# Patient Record
Sex: Female | Born: 1965 | Race: White | Hispanic: No | Marital: Married
Health system: Midwestern US, Community
[De-identification: ages and names within clinical notes are randomized; demographics above are authoritative.]

## PROBLEM LIST (undated history)

## (undated) DIAGNOSIS — Z3480 Encounter for supervision of other normal pregnancy, unspecified trimester: Secondary | ICD-10-CM

## (undated) DIAGNOSIS — O9989 Other specified diseases and conditions complicating pregnancy, childbirth and the puerperium: Secondary | ICD-10-CM

## (undated) DIAGNOSIS — O139 Gestational [pregnancy-induced] hypertension without significant proteinuria, unspecified trimester: Secondary | ICD-10-CM

## (undated) DIAGNOSIS — O99810 Abnormal glucose complicating pregnancy: Secondary | ICD-10-CM

## (undated) DIAGNOSIS — O26859 Spotting complicating pregnancy, unspecified trimester: Secondary | ICD-10-CM

## (undated) DIAGNOSIS — F419 Anxiety disorder, unspecified: Secondary | ICD-10-CM

## (undated) DIAGNOSIS — I10 Essential (primary) hypertension: Secondary | ICD-10-CM

## (undated) DIAGNOSIS — R87629 Unspecified abnormal cytological findings in specimens from vagina: Secondary | ICD-10-CM

## (undated) DIAGNOSIS — M1990 Unspecified osteoarthritis, unspecified site: Secondary | ICD-10-CM

## (undated) DIAGNOSIS — F32A Depression, unspecified: Secondary | ICD-10-CM

## (undated) DIAGNOSIS — E785 Hyperlipidemia, unspecified: Secondary | ICD-10-CM

## (undated) HISTORY — PX: WISDOM TOOTH EXTRACTION: SHX21

## (undated) HISTORY — DX: Anxiety disorder, unspecified: F41.9

## (undated) HISTORY — PX: KIDNEY SURGERY: SHX687

## (undated) HISTORY — DX: Essential (primary) hypertension: I10

## (undated) HISTORY — DX: Depression, unspecified: F32.A

## (undated) HISTORY — PX: COLPOSCOPY: SHX161

## (undated) HISTORY — DX: Unspecified abnormal cytological findings in specimens from vagina: R87.629

## (undated) HISTORY — DX: Unspecified osteoarthritis, unspecified site: M19.90

## (undated) HISTORY — DX: Hyperlipidemia, unspecified: E78.5

---

## 2000-11-05 ENCOUNTER — Emergency Department (HOSPITAL_COMMUNITY): Admission: EM | Admit: 2000-11-05 | Discharge: 2000-11-05 | Payer: Self-pay | Admitting: Emergency Medicine

## 2000-11-21 ENCOUNTER — Emergency Department (HOSPITAL_COMMUNITY): Admission: EM | Admit: 2000-11-21 | Discharge: 2000-11-21 | Payer: Self-pay | Admitting: Emergency Medicine

## 2001-03-26 ENCOUNTER — Encounter: Admission: RE | Admit: 2001-03-26 | Discharge: 2001-03-26 | Payer: Self-pay | Admitting: Obstetrics

## 2001-03-26 ENCOUNTER — Other Ambulatory Visit: Admission: RE | Admit: 2001-03-26 | Discharge: 2001-03-26 | Payer: Self-pay | Admitting: Obstetrics

## 2001-10-13 ENCOUNTER — Other Ambulatory Visit: Admission: RE | Admit: 2001-10-13 | Discharge: 2001-10-13 | Payer: Self-pay | Admitting: *Deleted

## 2003-01-14 ENCOUNTER — Encounter: Admission: RE | Admit: 2003-01-14 | Discharge: 2003-01-14 | Payer: Self-pay | Admitting: Internal Medicine

## 2003-08-29 ENCOUNTER — Encounter: Admission: RE | Admit: 2003-08-29 | Discharge: 2003-08-29 | Payer: Self-pay | Admitting: Internal Medicine

## 2003-08-29 ENCOUNTER — Ambulatory Visit (HOSPITAL_COMMUNITY): Admission: RE | Admit: 2003-08-29 | Discharge: 2003-08-29 | Payer: Self-pay | Admitting: Internal Medicine

## 2003-08-29 ENCOUNTER — Encounter: Payer: Self-pay | Admitting: Internal Medicine

## 2004-06-29 ENCOUNTER — Emergency Department (HOSPITAL_COMMUNITY): Admission: EM | Admit: 2004-06-29 | Discharge: 2004-06-29 | Payer: Self-pay | Admitting: Family Medicine

## 2004-07-02 ENCOUNTER — Emergency Department (HOSPITAL_COMMUNITY): Admission: EM | Admit: 2004-07-02 | Discharge: 2004-07-02 | Payer: Self-pay | Admitting: Family Medicine

## 2012-03-12 LAB — BLOOD TYPE, (ABO+RH)
ABO,Rh: O POS
TYPE, ABO & RH, EXTERNAL: O POS

## 2012-03-12 LAB — HIV-1 AB WESTERN BLOT CONFIRM ONLY
HIV, EXTERNAL: NEGATIVE
HIV, External: NEGATIVE

## 2012-03-12 LAB — HEMOGLOBIN: Hgb, External: 10.3

## 2012-03-12 LAB — ANTIBODY SCREEN: Antibody screen, External: NEGATIVE

## 2012-03-12 LAB — RUBELLA AB, IGM

## 2012-03-12 LAB — N GONORRHOEAE, DNA PROBE: Gonorrhea, External: NEGATIVE

## 2012-03-12 LAB — HEP B SURFACE AG: HBsAg, External: NEGATIVE

## 2012-03-12 LAB — CHLAMYDIA DNA PROBE: Chlamydia, External: NEGATIVE

## 2012-03-12 LAB — HM PAP SMEAR: Pap Smear, External: NEGATIVE

## 2012-03-12 LAB — HEMATOCRIT: Hct, External: 32.6

## 2012-03-12 LAB — GLUCOSE, 1 HR: GTT, 1 hr, Glucola, External: 105

## 2012-05-19 ENCOUNTER — Inpatient Hospital Stay
Admit: 2012-05-19 | Discharge: 2012-05-25 | Disposition: A | Discharge status: Home / Self Care | Payer: MEDICAID | Source: Ambulatory Visit | Attending: Obstetrics & Gynecology | Admitting: Obstetrics & Gynecology

## 2012-05-19 DIAGNOSIS — IMO0002 Reserved for concepts with insufficient information to code with codable children: Secondary | ICD-10-CM

## 2012-05-19 LAB — URINALYSIS W/ REFLEX CULTURE
Bilirubin: NEGATIVE
Blood: NEGATIVE
Glucose: NEGATIVE MG/DL
Ketone: NEGATIVE MG/DL
Nitrites: NEGATIVE
Protein: NEGATIVE MG/DL
Specific gravity: 1.016 (ref 1.003–1.030)
Urobilinogen: 0.2 EU/DL (ref 0.2–1.0)
pH (UA): 7 (ref 5.0–8.0)

## 2012-05-19 LAB — URIC ACID: Uric acid: 3.5 MG/DL (ref 2.6–6.0)

## 2012-05-19 LAB — METABOLIC PANEL, COMPREHENSIVE
A-G Ratio: 0.6 — ABNORMAL LOW (ref 1.1–2.2)
ALT (SGPT): 16 U/L (ref 12–78)
AST (SGOT): 16 U/L (ref 15–37)
Albumin: 2.6 g/dL — ABNORMAL LOW (ref 3.5–5.0)
Alk. phosphatase: 101 U/L (ref 50–136)
Anion gap: 13 mmol/L (ref 5–15)
BUN/Creatinine ratio: 16 (ref 12–20)
BUN: 9 MG/DL (ref 6–20)
Bilirubin, total: 0.2 MG/DL (ref 0.2–1.0)
CO2: 24 MMOL/L (ref 21–32)
Calcium: 8.5 MG/DL (ref 8.5–10.1)
Chloride: 101 MMOL/L (ref 97–108)
Creatinine: 0.56 MG/DL (ref 0.45–1.15)
GFR est AA: >60 mL/min/{1.73_m2} (ref >60)
GFR est non-AA: >60 mL/min/{1.73_m2} (ref >60)
Globulin: 4.1 g/dL — ABNORMAL HIGH (ref 2.0–4.0)
Glucose: 88 MG/DL (ref 65–100)
Potassium: 3.7 MMOL/L (ref 3.5–5.1)
Protein, total: 6.7 g/dL (ref 6.4–8.2)
Sodium: 138 MMOL/L (ref 136–145)

## 2012-05-19 LAB — CBC WITH AUTOMATED DIFF
ABS. BASOPHILS: 0 10*3/uL (ref 0.0–0.1)
ABS. EOSINOPHILS: 0.4 10*3/uL (ref 0.0–0.4)
ABS. LYMPHOCYTES: 2.2 10*3/uL (ref 0.8–3.5)
ABS. MONOCYTES: 1 10*3/uL (ref 0.0–1.0)
ABS. NEUTROPHILS: 10.9 10*3/uL — ABNORMAL HIGH (ref 1.8–8.0)
BASOPHILS: 0 % (ref 0–1)
EOSINOPHILS: 3 % (ref 0–7)
HCT: 34.8 % — ABNORMAL LOW (ref 35.0–47.0)
HGB: 11.4 g/dL — ABNORMAL LOW (ref 11.5–16.0)
LYMPHOCYTES: 15 % (ref 12–49)
MCH: 24.2 PG — ABNORMAL LOW (ref 26.0–34.0)
MCHC: 32.8 g/dL (ref 30.0–36.5)
MCV: 73.7 FL — ABNORMAL LOW (ref 80.0–99.0)
MONOCYTES: 7 % (ref 5–13)
NEUTROPHILS: 75 % (ref 32–75)
PLATELET: 182 10*3/uL (ref 150–400)
RBC: 4.72 M/uL (ref 3.80–5.20)
RDW: 22.1 % — ABNORMAL HIGH (ref 11.5–14.5)
WBC: 14.5 10*3/uL — ABNORMAL HIGH (ref 3.6–11.0)

## 2012-05-19 LAB — LD: LD: 167 U/L (ref 81–246)

## 2012-05-19 NOTE — H&P (Signed)
History & Physical    Name: Julie Daniels MRN: [id number]  SSN: [id number]    Date of Birth: [birth year]  Age: 46 y.o.  Sex: female      Subjective:     Reason for Admission:  Pregnancy and hypertension    History of Present Illness: Julie Daniels is a 46 y.o. Caucasian female with an estimated gestational age of [redacted]w[redacted]d with Estimated Date of Delivery: 08/08/12. She was seen in the office today for routine OB visit.  BPs=150/80, 160/82.  She presented late for prenatal care @ 19+wks, BPs have been mildly elevated in office.  BPs at home 120s-130s/60s-70s.  Patient denies abdominal pain  , chest pain, headache , right upper quadrant pain  , shortness of breath, swelling, vaginal bleeding  and vaginal leaking of fluid .    POBHx significant for previous C/S via vertical incision with extension into upper fundus.    Rubella equivocal    DSR=1:18. Declined invasive testing.    Has been followed by MFM with monthly growth scans.    OB History     Grav Para Term Preterm Abortions TAB SAB Ect Mult Living    4 3                Past Medical History   Diagnosis Date   ??? Essential hypertension      Past Surgical History   Procedure Date   ??? Pr cesarean delivery only      1999   ??? Hx other surgical      ectopic pregnancy removal     Social History     Occupational History   ??? Not on file.     Social History Main Topics   ??? Smoking status: Former Smoker     Quit date: 09/16/2003   ??? Smokeless tobacco: Never Used   ??? Alcohol Use: No   ??? Drug Use: No   ??? Sexually Active: No      No family history on file.    Allergies   Allergen Reactions   ??? Tape (Adhesive) Itching     Prior to Admission medications    Medication Sig Start Date End Date Taking? Authorizing Provider   prenatal vit-fe fum-fa-dss (PRENATAL 19) 29-1 mg Tab Take  by mouth.   Yes Historical Provider   ferrous sulfate (IRON) 325 mg (65 mg iron) tablet Take  by mouth Daily (before breakfast).   Yes Historical Provider        Review of Systems:  A comprehensive review of  systems was negative except for that written in the History of Present Illness.     Objective:     Vitals:    Filed Vitals:    05/19/12 1413 05/19/12 1428 05/19/12 1443 05/19/12 1458   BP: 163/73 145/70 145/78 145/77   Pulse: 81 77 77 80   Resp:       Height:       Weight:          No data recorded.    BP  Min: 145/77  Max: 178/84     Physical Exam:  Patient without distress.  Heart: Regular rate and rhythm or S1S2 present  Lung: clear to auscultation throughout lung fields, no wheezes, no rales, no rhonchi and normal respiratory effort  Abdomen: soft, nontender  Fundus: soft and non tender  Lower Extremities:  - Edema No   - No evidence of DVT seen on physical exam.  Negative Homan's sign.  No cords or  calf tenderness.   - Patellar Reflexes: 2+ bilaterally   - Clonus: absent     Membranes:  Intact  Uterine Activity:  None  Fetal Heart Rate:  Reactive  Baseline: 140s-150s per minute       Labs:   Recent Results (from the past 24 hour(s))   CBC WITH AUTOMATED DIFF    Collection Time    05/19/12  1:30 PM       Component Value Range    WBC 14.5 (*) 3.6 - 11.0 K/uL    RBC 4.72  3.80 - 5.20 M/uL    HGB 11.4 (*) 11.5 - 16.0 g/dL    HCT 16.1 (*) 09.6 - 47.0 %    MCV 73.7 (*) 80.0 - 99.0 FL    MCH 24.2 (*) 26.0 - 34.0 PG    MCHC 32.8  30.0 - 36.5 g/dL    RDW 04.5 (*) 40.9 - 14.5 %    PLATELET 182  150 - 400 K/uL    NEUTROPHILS 75  32 - 75 %    LYMPHOCYTES 15  12 - 49 %    MONOCYTES 7  5 - 13 %    EOSINOPHILS 3  0 - 7 %    BASOPHILS 0  0 - 1 %    ABS. NEUTROPHILS 10.9 (*) 1.8 - 8.0 K/UL    ABS. LYMPHOCYTES 2.2  0.8 - 3.5 K/UL    ABS. MONOCYTES 1.0  0.0 - 1.0 K/UL    ABS. EOSINOPHILS 0.4  0.0 - 0.4 K/UL    ABS. BASOPHILS 0.0  0.0 - 0.1 K/UL    DF SMEAR SCANNED      RBC COMMENTS        Value: 2+ ANISOCYTOSIS      1+ MICROCYTOSIS   URIC ACID    Collection Time    05/19/12  1:30 PM       Component Value Range    Uric acid 3.5  2.6 - 6.0 MG/DL   LD    Collection Time    05/19/12  1:30 PM       Component Value Range    LD 167  81 - 246  U/L   METABOLIC PANEL, COMPREHENSIVE    Collection Time    05/19/12  1:30 PM       Component Value Range    Sodium 138  136 - 145 MMOL/L    Potassium 3.7  3.5 - 5.1 MMOL/L    Chloride 101  97 - 108 MMOL/L    CO2 24  21 - 32 MMOL/L    Anion gap 13  5 - 15 mmol/L    Glucose 88  65 - 100 MG/DL    BUN 9  6 - 20 MG/DL    Creatinine 8.11  9.14 - 1.15 MG/DL    BUN/Creatinine ratio 16  12 - 20      GFR est AA >60  >60 ml/min/1.107m2    GFR est non-AA >60  >60 ml/min/1.41m2    Calcium 8.5  8.5 - 10.1 MG/DL    Bilirubin, total 0.2  0.2 - 1.0 MG/DL    ALT 16  12 - 78 U/L    AST 16  15 - 37 U/L    Alk. phosphatase 101  50 - 136 U/L    Protein, total 6.7  6.4 - 8.2 g/dL    Albumin 2.6 (*) 3.5 - 5.0 g/dL    Globulin 4.1 (*) 2.0 - 4.0 g/dL  A-G Ratio 0.6 (*) 1.1 - 2.2     URINALYSIS W/ REFLEX CULTURE    Collection Time    05/19/12  1:30 PM       Component Value Range    Color YELLOW      Appearance CLOUDY      Specific gravity 1.016  1.003 - 1.030      pH 7.0  5.0 - 8.0      Protein NEGATIVE   NEGATIVE MG/DL    Glucose NEGATIVE   NEGATIVE MG/DL    Ketone NEGATIVE   NEGATIVE MG/DL    Bilirubin NEGATIVE   NEGATIVE    Blood NEGATIVE   NEGATIVE    Urobilinogen 0.2  0.2 - 1.0 EU/DL    Nitrites NEGATIVE   NEGATIVE    Leukocyte Esterase SMALL (*) NEGATIVE    WBC 20-50  0 - 4 /HPF    RBC 5-10  0 - 5 /HPF    Epithelial cells 0-5  0 - 5 /LPF    Bacteria 1+ (*) NEGATIVE /HPF    UA:UC IF INDICATED URINE CULTURE ORDERED      Hyaline Cast 0-2  0 - 2       Assessment and Plan:     1.  Hypertension  -labs wnl  -24hr urine in progress  - if total protein 300mg , may consider starting labetalol as she likely has some underlying CHTN.    2.  Previous C/S via vertical incision with extension into upper fundus per operative report.  - not candidate for TOLAC    3.  Rubella equivocal.    - vaccinate postpartum    4.  DSR=1:18  - declined invasive testing    Signed By:  Ileana Ladd, MD     May 19, 2012

## 2012-05-19 NOTE — Progress Notes (Signed)
1300: Patient arrived from Dr. Milinda Hirschfeld office. Blood pressures elevated the patient educated on 24 hour urine collection and states understanding of process. Will continue to monitor.   1500: Dr. Ketter Basil notified of patient blood pressures 145/80 without symptoms at this time. States patient needs to stay here in labor and delivery to complete 24 hour urine sample. Orders for regular diet, 4hour vital signs and nst bid received will continue to monitor.   1720: Dr. Ahrendt Basil rounding on unit, patient blood pressures 180/90's while MD at bedside. Patient blood pressures to be completed more frequently at this time, will continue to monitor.  1920: Bedside and Verbal shift change report given to Dominga Ferry (oncoming nurse) by [place], RN (offgoing nurse).  Report given with SBAR, Kardex, MAR, Accordion and Recent Results.

## 2012-05-19 NOTE — Progress Notes (Signed)
Bedside and Verbal shift change report given to J Theodorous-Smith RN (oncoming nurse) by Lowanda Foster RN (offgoing nurse).  Report given with SBAR, Kardex, Intake/Output and MAR.

## 2012-05-20 LAB — PROTEIN, URINE, 24 HR
Period of collection: 24 hr
Protein,urine 24 hr: 324 mg/24hr — ABNORMAL HIGH (ref <149)
Volume: 1800 mL

## 2012-05-20 NOTE — Progress Notes (Signed)
Antepartum Obstetrics Progress Note    Name: Julie Daniels MRN: [id number]  SSN: [id number]    Date of Birth: 04-17-1966  Age: 46 y.o.  Sex: female      Subjective:      LOS: 1 day    Estimated Date of Delivery: 08/08/12   Gestational Age Today: [redacted]w[redacted]d     Patient admitted for hypertension. States she does have normal fetal movement and does not have headache , right upper quadrant pain  , shortness of breath, swelling, vaginal leaking of fluid  and visual disturbances.    Objective:     Vitals:  Blood pressure 135/60, pulse 80, temperature 98.2 ??F (36.8 ??C), resp. rate 20, height 5\' 6"  (1.676 m), weight 110.678 kg (244 lb).   Temp (24hrs), Avg:98.1 ??F (36.7 ??C), Min:97.9 ??F (36.6 ??C), Max:98.3 ??F (36.8 ??C)    Systolic (24hrs), Avg:158 mmHg, Min:135 mmHg, Max:178 mmHg     Diastolic (24hrs), Avg:76 mmHg, Min:60 mmHg, Max:90 mmHg       Intake and Output:          Physical Exam:  Patient without distress.  Heart: Regular rate and rhythm or S1S2 present  Lung: clear to auscultation throughout lung fields, no wheezes, no rales, no rhonchi and normal respiratory effort  Abdomen: soft, nontender  Fundus: soft and non tender  Lower Extremities:  - Edema No   - No evidence of DVT seen on physical exam.  Negative Homan's sign.  No cords or calf tenderness.   - Patellar Reflexes: 1+ bilaterally   - Clonus: absent       Membranes:  Intact    Uterine Activity:  None    Fetal Heart Rate:  Reassuring, +accels  Baseline: 140s-150s per minute        Labs:   Recent Results (from the past 36 hour(s))   PROTEIN, URINE, 24 HR    Collection Time    05/19/12  1:15 PM       Component Value Range    Period of collection 24      Volume 1800      Protein,urine 24 hr 324 (*) <149 mg/24hr   CBC WITH AUTOMATED DIFF    Collection Time    05/19/12  1:30 PM       Component Value Range    WBC 14.5 (*) 3.6 - 11.0 K/uL    RBC 4.72  3.80 - 5.20 M/uL    HGB 11.4 (*) 11.5 - 16.0 g/dL    HCT 84.1 (*) 32.4 - 47.0 %    MCV 73.7 (*) 80.0 - 99.0 FL    MCH 24.2 (*)  26.0 - 34.0 PG    MCHC 32.8  30.0 - 36.5 g/dL    RDW 40.1 (*) 02.7 - 14.5 %    PLATELET 182  150 - 400 K/uL    NEUTROPHILS 75  32 - 75 %    LYMPHOCYTES 15  12 - 49 %    MONOCYTES 7  5 - 13 %    EOSINOPHILS 3  0 - 7 %    BASOPHILS 0  0 - 1 %    ABS. NEUTROPHILS 10.9 (*) 1.8 - 8.0 K/UL    ABS. LYMPHOCYTES 2.2  0.8 - 3.5 K/UL    ABS. MONOCYTES 1.0  0.0 - 1.0 K/UL    ABS. EOSINOPHILS 0.4  0.0 - 0.4 K/UL    ABS. BASOPHILS 0.0  0.0 - 0.1 K/UL    DF SMEAR SCANNED  RBC COMMENTS        Value: 2+ ANISOCYTOSIS      1+ MICROCYTOSIS   URIC ACID    Collection Time    05/19/12  1:30 PM       Component Value Range    Uric acid 3.5  2.6 - 6.0 MG/DL   LD    Collection Time    05/19/12  1:30 PM       Component Value Range    LD 167  81 - 246 U/L   METABOLIC PANEL, COMPREHENSIVE    Collection Time    05/19/12  1:30 PM       Component Value Range    Sodium 138  136 - 145 MMOL/L    Potassium 3.7  3.5 - 5.1 MMOL/L    Chloride 101  97 - 108 MMOL/L    CO2 24  21 - 32 MMOL/L    Anion gap 13  5 - 15 mmol/L    Glucose 88  65 - 100 MG/DL    BUN 9  6 - 20 MG/DL    Creatinine 0.98  1.19 - 1.15 MG/DL    BUN/Creatinine ratio 16  12 - 20      GFR est AA >60  >60 ml/min/1.56m2    GFR est non-AA >60  >60 ml/min/1.29m2    Calcium 8.5  8.5 - 10.1 MG/DL    Bilirubin, total 0.2  0.2 - 1.0 MG/DL    ALT 16  12 - 78 U/L    AST 16  15 - 37 U/L    Alk. phosphatase 101  50 - 136 U/L    Protein, total 6.7  6.4 - 8.2 g/dL    Albumin 2.6 (*) 3.5 - 5.0 g/dL    Globulin 4.1 (*) 2.0 - 4.0 g/dL    A-G Ratio 0.6 (*) 1.1 - 2.2     URINALYSIS W/ REFLEX CULTURE    Collection Time    05/19/12  1:30 PM       Component Value Range    Color YELLOW      Appearance CLOUDY      Specific gravity 1.016  1.003 - 1.030      pH 7.0  5.0 - 8.0      Protein NEGATIVE   NEGATIVE MG/DL    Glucose NEGATIVE   NEGATIVE MG/DL    Ketone NEGATIVE   NEGATIVE MG/DL    Bilirubin NEGATIVE   NEGATIVE    Blood NEGATIVE   NEGATIVE    Urobilinogen 0.2  0.2 - 1.0 EU/DL    Nitrites NEGATIVE   NEGATIVE     Leukocyte Esterase SMALL (*) NEGATIVE    WBC 20-50  0 - 4 /HPF    RBC 5-10  0 - 5 /HPF    Epithelial cells 0-5  0 - 5 /LPF    Bacteria 1+ (*) NEGATIVE /HPF    UA:UC IF INDICATED URINE CULTURE ORDERED      Hyaline Cast 0-2  0 - 2       Assessment and Plan:      Active Problems:   * No active hospital problems. *      Mild preeclampsia.  24hr urine=324mg , previously 202.  reconsult MFM in AM  BPs still labile, but overall a little improved.   SCDs    Signed By: Ileana Ladd, MD     May 20, 2012

## 2012-05-20 NOTE — Progress Notes (Signed)
Bedside and Verbal shift change report given to [place] RN (Cabin crew) by Arna Snipe RN (offgoing nurse).  Report given with SBAR, Kardex, Intake/Output and MAR.

## 2012-05-20 NOTE — Progress Notes (Addendum)
0715: Report received from Corbin Ade., RN, Will continue to monitor.   0900: Patient blood pressures elevated denies  symptoms will continue to monitor.   1100: Dr. Linam Basil rounding on unit, MD made aware that blood pressures 150's/80s and labile. Will continue to monitor.   1715: 24 hour urine back at this time. Dr Piet Basil notified of result plan to keep patient again tonight for mfm consult in the morning. Parameters to call MD for multiple blood pressures 160/110. Will continue to monitor.   1745: Dr. Whitt Basil on unit, patient made aware plan of care, will continue to monitor.   1955:Bedside and Verbal shift change report given to Vernona Rieger, Charity fundraiser (Cabin crew) by [place], RN (offgoing nurse).  Report given with SBAR, Kardex, MAR and Recent Results.

## 2012-05-21 LAB — CULTURE, URINE
Colonies Counted: <1000
Colony Count: <1000
Culture result:: NO GROWTH
Culture: NO GROWTH

## 2012-05-21 LAB — URINALYSIS W/ REFLEX CULTURE
Bacteria: NEGATIVE /HPF
Bilirubin: NEGATIVE
Glucose: NEGATIVE MG/DL
Leukocyte Esterase: NEGATIVE
Nitrites: NEGATIVE
Protein: NEGATIVE MG/DL
Specific gravity: 1.018 (ref 1.003–1.030)
Urobilinogen: 0.2 EU/DL (ref 0.2–1.0)
pH (UA): 7 (ref 5.0–8.0)

## 2012-05-21 MED FILL — NYSTATIN 100,000 UNIT/G TOPICAL POWDER: 100000 unit/gram | CUTANEOUS | Qty: 15

## 2012-05-21 NOTE — Progress Notes (Signed)
Sat with pt and held EFM under pannus.  FHR 160, fetal movement audible, accels to 175-180 with FM.

## 2012-05-21 NOTE — Consults (Signed)
Indication: Advanced Maternal Age 46, Polyhydramnios  657.03, Obesity 649.13/278.00.   Maternal disease: Hypertension.   ____________________________________________________________________________  History: Age: 46 years. Maternal age at [facility]: 46 years. Gravida: 4 Para: 3.   Current Pregnancy: Pre- pregnancy data: Weight 226 lbs. Height 5 ft 6 ins. BMI 36.5.  Obstetric History: Mode of last delivery: Vaginal Delivery x 2, then Cesarean section x 1.  ____________________________________________________________________________  Dating:  LMP: 12/10/11 EDC: 09/15/12 GA by LMP: [redacted]w[redacted]d  Earlier Assessment on: 03/12/12 EDC: 08/08/12 GA by earlier assessment: [redacted]w[redacted]d  Best Overall Assessment: 05/21/12 EDC: 08/08/12 Assessed GA: [redacted]w[redacted]d  The Best Overall Assessment is based on an earlier assessment on 03/12/12.  ____________________________________________________________________________  Growth Scan:  Singleton gestation.   Fetal heart activity: present. Fetal heart rate: 152 bpm.   Fetal presentation: vertex.   Cord: 3 vessels.   Placenta: anterior.     Summary of Ultrasound Findings:  Transabdominal US. U/S machine: GE Voluson E8 Expert. U/S view: limited by adiposity.     ____________________________________________________________________________  Fetal Wellbeing Assessment:  Amniotic fluid: polyhydramnios. AFI: 30.3 cm. MVP: 8.0 cm. Q1: 7.4 cm. Q2: 7.8 cm. Q3: 7.1 cm. Q4: 8.0 cm.   Biophysical Profile: Fetal body movements: normal (2), Fetal tone: normal (2), Fetal breathing movements: abnormal (0), Amniotic fluid volume: normal (2), Non-stress test: normal (2). Score 8 / 10.     ____________________________________________________________________________  Doppler:  Fetal Doppler:  Umbilical Artery: PS 48.1 cm/s    ED 20.19 cm/s    S/D ratio 2.34     RI 0.57     PI 0.79     TAMX 34.96 cm/s     ____________________________________________________________________________  Report Summary:  Impression: Biophysical profile  and umbilical artery Doppler studies are performed to evaluate fetal well-being. Exam is limited by maternal habitus (BMI 37).    The patient is 46 years old (AMA); her quad screen was abnormal, with a 1 in 18 risk of Down syndrome. She declined further testing. There are two prior term vaginal deliveries over 20 years ago. Her last pregnancy in 1999 was twins with a co-twin demise (diagnosed at 46 weeks). She went into labor and delivered her stillborn at 46 weeks, followed by a Cesarean section for the viable twin (was in [place]). She does not have a known history of hypertension, but her last encounter with a health care professional was 6 years ago. Blood pressures during the pregnancy are in the 140-150/70-80 range. She had an ultrasound two days ago in our office; her EFW was at the 59th percentile with polyhydramnios present (AFI 30.1 cm). By report her 50 gram screen was normal.     The patient was admitted on 05/19/12 due to concerns about preeclampsia. Blood pressures in the hospital are in the 135-170/60-90 range. A 24 hour urine detected 324 mg of protein. PIH labs are normal, and she denies any PIH symptoms.     Today fetal assessment is reassuring. Her polyhydramnios remains present, with an AFI of 30.3 cm. See BPP score and umbilical artery Doppler report above.    I am suspicious the patient does have chronic hypertension, possibly with superimposed mild preeclampsia. It is reasonable to start a dose of Labetalol, observe for 24-48 hours, then discharge her home. I would not continually increase her dose of antihypertensive medication, as this may mask worsening preeclampsia. I would get a 3 hour GTT while she is here (possible explanation of her poly) as long as she is at least 48 hours out from antenatal  corticosteroids (if they were given). The prior plan was to recheck amniotic fluid in a few weeks- this remains appropriate. I would start weekly antenatal testing at 32-34 weeks. All questions and  concerns were addressed.     Recommendations: Keep previously scheduled appointment on 06/16/12.

## 2012-05-21 NOTE — Progress Notes (Signed)
Antepartum Obstetrics Progress Note    Name: Julie Daniels MRN: [id number]  SSN: [id number]    Date of Birth: [birth year]  Age: 46 y.o.  Sex: female      Subjective:      LOS: 2 days    Estimated Date of Delivery: 08/08/12   Gestational Age Today: [redacted]w[redacted]d     Patient admitted for preeclampsia. States she does have normal fetal movement and some back pain and does not have abdominal pain  , headache , right upper quadrant pain  , shortness of breath, swelling, vaginal bleeding , vaginal leaking of fluid  and visual disturbances.    Objective:     Vitals:  Blood pressure 160/80, pulse 82, temperature 98 ??F (36.7 ??C), resp. rate 18, height 5\' 6"  (1.676 m), weight 110.678 kg (244 lb).   Temp (24hrs), Avg:98 ??F (36.7 ??C), Min:98 ??F (36.7 ??C), Max:98 ??F (36.7 ??C)    Systolic (24hrs), Avg:160 mmHg, Min:160 mmHg, Max:160 mmHg     Diastolic (24hrs), Avg:80 mmHg, Min:80 mmHg, Max:80 mmHg       Intake and Output:          Physical Exam:  Patient without distress.  Heart: Regular rate and rhythm or S1S2 present  Lung: clear to auscultation throughout lung fields, no wheezes, no rales, no rhonchi and normal respiratory effort  Abdomen: soft, nontender  Fundus: soft and non tender  Lower Extremities:  - Edema No   - No evidence of DVT seen on physical exam.  Negative Homan's sign.  No cords or calf tenderness.   - Patellar Reflexes: 1+ bilaterally   - Clonus: absent       Membranes:  Intact    Uterine Activity:  None    Fetal Heart Rate:  Baseline: 150s per minute, tracing this AM fragmented, will repeat this evening       Labs: No results found for this or any previous visit (from the past 36 hour(s)).    Assessment and Plan:         HTN.  Likely underlying CHTN with probable superimposed mild preeclampsia.  Seen by MFM today.  Recommend starting labetalol.  May be candidate for outpt management if good response.  Also recommend 3hr GTT for polyhydramnios.  -labetalol 100mg  BID  - NPO after MN, will try to do 3hr GTT tomorrow     Signed By: Ileana Ladd, MD     May 21, 2012

## 2012-05-21 NOTE — Progress Notes (Signed)
05/21/2012 02:34P CM met with pt to discuss her disposition.Pt has two adult children in [place].She has a 46 year old @ home.Her husband does not drive but is having a friend drive him where he needs to go.In discussing the seriousness of maintaining bedrest @ home if prescribed by her physician,pt states she will be able to maintain bedrest.She states she can direct her thirteen y.o to do chores for her from her bed.She states her husband will cook and help her also.Pt has a home blood pressure wrist monitor and will record and report high blood pressures to her physician..Pt is to determine systolic and diastolic parameters with her physician prior to discharge.Pt may buy urine strips @ the [employer] to check for protein in her urine if her physician feels this is necessary.Signs and symptoms to report to her physician reviewed with ZO:[id number],BJYNWGN in her urine,sudden weight gain,N/V,abdominal pain,headaches,visual changes,hyperreflexia.each back done with pt and pt was able to repeat back most symptoms.Pt informed that nursing will review these on her discharge also.Thank you.Marliss Czar

## 2012-05-22 LAB — GLUCOSE, GESTATIONAL, 3 HR TOLERANCE
GLUCOSE, 1 HR: 170 MG/DL (ref 65–180)
GLUCOSE, 2 HR: 156 MG/DL — ABNORMAL HIGH (ref 65–155)
GLUCOSE, 3 HR: 144 MG/DL — ABNORMAL HIGH (ref 65–140)
Glucose fasting, gestational: 94 MG/DL (ref 65–95)

## 2012-05-22 LAB — GLUCOSE, POC: Glucose (POC): 201 mg/dL — ABNORMAL HIGH (ref 65–105)

## 2012-05-22 MED ADMIN — labetalol (NORMODYNE) tablet 100 mg: ORAL | @ 16:00:00 | NDC 00172436460

## 2012-05-22 MED ADMIN — nystatin (MYCOSTATIN) 100,000 unit/gram powder: TOPICAL | @ 10:00:00 | NDC 68308015215

## 2012-05-22 MED ADMIN — labetalol (NORMODYNE) tablet 100 mg: ORAL | @ 01:00:00 | NDC 00172436460

## 2012-05-22 MED FILL — LABETALOL 100 MG TAB: 100 mg | ORAL | Qty: 1

## 2012-05-22 NOTE — Progress Notes (Signed)
Antepartum Obstetrics Progress Note    Name: Julie Daniels MRN: [id number]  SSN: [id number]    Date of Birth: January 07, 1966  Age: 46 y.o.  Sex: female      Subjective:      LOS: 3 days    Estimated Date of Delivery: 08/08/12   Gestational Age Today: [redacted]w[redacted]d     Patient admitted for hypertension. States she does have normal fetal movement and does not have contractions, headache , nausea and vomiting, right upper quadrant pain  , shortness of breath, swelling, vaginal bleeding  and vaginal leaking of fluid .    Objective:     Vitals:  Blood pressure 158/76, pulse 92, temperature 98.5 ??F (36.9 ??C), resp. rate 24, height 5\' 6"  (1.676 m), weight 110.678 kg (244 lb).   Temp (24hrs), Avg:98.1 ??F (36.7 ??C), Min:97.8 ??F (36.6 ??C), Max:98.5 ??F (36.9 ??C)    Systolic (24hrs), Avg:156 mmHg, Min:138 mmHg, Max:175 mmHg     Diastolic (24hrs), Avg:80 mmHg, Min:76 mmHg, Max:82 mmHg       Intake and Output:          Physical Exam:  Patient without distress.  Heart: Regular rate and rhythm or S1S2 present  Lung: clear to auscultation throughout lung fields, no wheezes, no rales, no rhonchi and normal respiratory effort  Abdomen: soft, nontender  Fundus: soft and non tender  Lower Extremities:  - Edema No   - No evidence of DVT seen on physical exam.  No cords or calf tenderness.   - Patellar Reflexes: 1+ bilaterally   - Clonus: absent       Membranes:  Intact    Uterine Activity:  None    Fetal Heart Rate:  Fragmented tracing this AM, reactive last PM        Labs:   Recent Results (from the past 36 hour(s))   URINALYSIS W/ REFLEX CULTURE    Collection Time    05/21/12  2:45 PM       Component Value Range    Color YELLOW      Appearance CLEAR      Specific gravity 1.018  1.003 - 1.030      pH 7.0  5.0 - 8.0      Protein NEGATIVE   NEGATIVE MG/DL    Glucose NEGATIVE   NEGATIVE MG/DL    Ketone TRACE (*) NEGATIVE MG/DL    Bilirubin NEGATIVE   NEGATIVE    Blood SMALL (*) NEGATIVE    Urobilinogen 0.2  0.2 - 1.0 EU/DL    Nitrites NEGATIVE    NEGATIVE    Leukocyte Esterase NEGATIVE   NEGATIVE    WBC 5-10  0 - 4 /HPF    RBC 10-20  0 - 5 /HPF    Epithelial cells 0-5  0 - 5 /LPF    Bacteria NEGATIVE   NEGATIVE /HPF    UA:UC IF INDICATED URINE CULTURE ORDERED      Hyaline Cast 0-2  0 - 2   GLUCOSE, GESTATIONAL, 3 HR TOLERANCE    Collection Time    05/22/12  6:00 AM       Component Value Range    GESTATIONAL 3HR GTT          GLUCOSE, FASTING 94  65 - 95 MG/DL    GLUCOSE, 1 HR 782  65 - 180 MG/DL    GLUCOSE, 2 HR 956 (*) 65 - 155 MG/DL    GLUCOSE, 3 HR 213 (*) 65 - 140 MG/DL  GLUCOSE, POC    Collection Time    05/22/12  9:02 AM       Component Value Range    POC GLUCOSE 201 (*) 65 - 105 mg/dL    Performed by Roddie Mc         Assessment and Plan:      Active Problems:   * No active hospital problems. *      1.  Hypertension.  Likely chronic hypertension.  Mild proteinuria (324mg  on 24hr urine).  Started on labetalol as per MFM recommendations.  Has had 2 doses.  Some improvement in BPs.  Will continue.  If BPs tomorrow 130s-140s/80s, then should be OK for outpt management.  Rec she continue BR at home.  If d/c'd home, then will need jug to collect 24 hr urine and turn in to office on Tuesday.    2.  GDM.  3hr GTT done today for polyhydramnios.  Results c/w GDM.  Will place DM consult.        Signed By: Ileana Ladd, MD     May 22, 2012

## 2012-05-22 NOTE — Progress Notes (Addendum)
0800:  Mrs. Metsker given oral glucose 3 hr gtt liquid, orange 100gram.    Prenatal vitamin, zantac 150mg  tablet and iron pill 325mg  tablet taken now.

## 2012-05-23 LAB — CULTURE, URINE
Colonies Counted: <1000
Colony Count: <1000
Culture result:: NO GROWTH
Culture: NO GROWTH

## 2012-05-23 LAB — GLUCOSE, POC
Glucose (POC): 142 mg/dL — ABNORMAL HIGH (ref 65–105)
Glucose (POC): 99 mg/dL (ref 65–105)
Glucose (POC): 102 mg/dL (ref 65–105)
Glucose (POC): 164 mg/dL — ABNORMAL HIGH (ref 65–105)
Glucose (POC): 104 mg/dL (ref 65–105)

## 2012-05-23 MED ADMIN — nystatin (MYCOSTATIN) 100,000 unit/gram powder: TOPICAL | @ 13:00:00 | NDC 68308015215

## 2012-05-23 MED ADMIN — labetalol (NORMODYNE) tablet 100 mg: ORAL | @ 14:00:00 | NDC 00172436460

## 2012-05-23 MED ADMIN — labetalol (NORMODYNE) tablet 100 mg: ORAL | @ 01:00:00 | NDC 00172436460

## 2012-05-23 MED FILL — LABETALOL 100 MG TAB: 100 mg | ORAL | Qty: 1

## 2012-05-23 NOTE — Progress Notes (Signed)
High Risk Obstetrics Progress Note    Name: Julie Daniels MRN: [id number]  SSN: [id number]    Date of Birth: 1966/02/06  Age: 46 y.o.  Sex: female      Subjective:      LOS: 4 days    Estimated Date of Delivery: 08/08/12   Gestational Age Today: [redacted]w[redacted]d     Patient admitted for chronic hypertension. States she does not have abdominal pain  , chest pain, contractions, fever, headache  and nausea and vomiting.    Objective:     Vitals:  Blood pressure 144/81, pulse 78, temperature 98 ??F (36.7 ??C), resp. rate 18, height 5\' 6"  (1.676 m), weight 110.678 kg (244 lb).   Temp (24hrs), Avg:98.4 ??F (36.9 ??C), Min:98 ??F (36.7 ??C), Max:98.6 ??F (37 ??C)    Systolic (24hrs), Avg:148 mmHg, Min:117 mmHg, Max:175 mmHg     Diastolic (24hrs), Avg:75 mmHg, Min:60 mmHg, Max:89 mmHg       Intake and Output:          Physical Exam:  Patient without distress.  Heart: Regular rate and rhythm  Lung: clear to auscultation throughout lung fields and normal respiratory effort  Perineum: blood absent, amniotic fluid absent       Membranes:  Intact    Uterine Activity:  None    Fetal Heart Rate:  Reactive        Labs:   Recent Results (from the past 36 hour(s))   GLUCOSE, GESTATIONAL, 3 HR TOLERANCE    Collection Time    05/22/12  6:00 AM       Component Value Range    GESTATIONAL 3HR GTT          GLUCOSE, FASTING 94  65 - 95 MG/DL    GLUCOSE, 1 HR 782  65 - 180 MG/DL    GLUCOSE, 2 HR 956 (*) 65 - 155 MG/DL    GLUCOSE, 3 HR 213 (*) 65 - 140 MG/DL   GLUCOSE, POC    Collection Time    05/22/12  9:02 AM       Component Value Range    POC GLUCOSE 201 (*) 65 - 105 mg/dL    Performed by Roddie Mc     GLUCOSE, POC    Collection Time    05/22/12  9:17 PM       Component Value Range    POC GLUCOSE 142 (*) 65 - 105 mg/dL    Performed by Verna Czech     GLUCOSE, POC    Collection Time    05/23/12  8:49 AM       Component Value Range    POC GLUCOSE 99  65 - 105 mg/dL    Performed by Melford Aase         Assessment and Plan:      Active Problems:   * No  active hospital problems. *      Diabetes-Gestational:  Blood sugar monitoring at fasting and 2 hours after each meal Chronic HTN   Polyhydramnios NST q shift  Continue POC  Patient seen and evaluated by Ailene Ards MD    Signed By: Lynford Citizen, MD     May 23, 2012

## 2012-05-23 NOTE — Progress Notes (Addendum)
Verbal shift change report given to Serita Kyle, RN (oncoming nurse) by Neena Rhymes, RN (offgoing nurse).  Report given with SBAR, MAR and Recent Results'.       2038- Accucheck right middle finger, 103    2130- Discussed possible plans of care for pt. Pt expresses concern over not being able to be discharged tomorrow. Discussed plan of care and pt given opportunity to ask questions.    24- Verbal shift change report given to Melford Aase, RN (oncoming nurse) by Serita Kyle, rn (offgoing nurse).  Report given with SBAR, MAR and Recent Results.

## 2012-05-24 LAB — GLUCOSE, POC
Glucose (POC): 103 mg/dL (ref 65–105)
Glucose (POC): 103 mg/dL (ref 65–105)
Glucose (POC): 105 mg/dL (ref 65–105)
Glucose (POC): 99 mg/dL (ref 65–105)

## 2012-05-24 MED ADMIN — labetalol (NORMODYNE) tablet 100 mg: ORAL | @ 01:00:00 | NDC 00172436460

## 2012-05-24 MED ADMIN — labetalol (NORMODYNE) tablet 100 mg: ORAL | @ 13:00:00 | NDC 00172436460

## 2012-05-24 MED FILL — LABETALOL 100 MG TAB: 100 mg | ORAL | Qty: 1

## 2012-05-24 NOTE — Progress Notes (Signed)
High Risk Obstetrics Progress Note    Name: Julie Daniels MRN: [id number]  SSN: [id number]    Date of Birth: 05-03-1966  Age: 46 y.o.  Sex: female      Subjective:      LOS: 5 days    Estimated Date of Delivery: 08/08/12   Gestational Age Today: [redacted]w[redacted]d     Patient admitted for chronic hypertension. States she does not have abdominal pain  , contractions, nausea and vomiting, right upper quadrant pain   and shortness of breath. Pt reporting small amount of spotting notice while wiping.    Objective:     Vitals:  Blood pressure 134/66, pulse 80, temperature 98 ??F (36.7 ??C), resp. rate 18, height 5\' 6"  (1.676 m), weight 110.678 kg (244 lb).   Temp (24hrs), Avg:77.9 ??F (25.5 ??C), Min:37.4 ??F (3 ??C), Max:98.2 ??F (36.8 ??C)    Systolic (24hrs), Avg:143 mmHg, Min:133 mmHg, Max:159 mmHg     Diastolic (24hrs), Avg:71 mmHg, Min:60 mmHg, Max:82 mmHg       Intake and Output:          Physical Exam:  Patient without distress.       Membranes:  Intact    Uterine Activity:  None    Fetal Heart Rate:  Reactive        Labs:   Recent Results (from the past 36 hour(s))   GLUCOSE, POC    Collection Time    05/23/12  8:49 AM       Component Value Range    POC GLUCOSE 99  65 - 105 mg/dL    Performed by Melford Aase     GLUCOSE, POC    Collection Time    05/23/12 12:17 PM       Component Value Range    POC GLUCOSE 102  65 - 105 mg/dL    Performed by Neena Rhymes     GLUCOSE, POC    Collection Time    05/23/12  3:38 PM       Component Value Range    POC GLUCOSE 164 (*) 65 - 105 mg/dL    Performed by Neena Rhymes     GLUCOSE, POC    Collection Time    05/23/12  4:44 PM       Component Value Range    POC GLUCOSE 104  65 - 105 mg/dL    Performed by Neena Rhymes     GLUCOSE, POC    Collection Time    05/23/12  8:51 PM       Component Value Range    POC GLUCOSE 103  65 - 105 mg/dL    Performed by Serita Kyle     GLUCOSE, POC    Collection Time    05/24/12  8:06 AM       Component Value Range    POC GLUCOSE 103  65 - 105 mg/dL    Performed by Melford Aase         Assessment and Plan:      Active Problems:   * No active hospital problems. *    Chronic HTN, gestational diabetic  Continue monitoring      Signed By: Theora Master, MD     May 24, 2012

## 2012-05-24 NOTE — Progress Notes (Addendum)
Verbal shift change report given to Serita Kyle, RN (oncoming nurse) by Melford Aase, RN (offgoing nurse).  Report given with SBAR, MAR and Recent Results.     2300- Iced added to 24 hour urine.

## 2012-05-24 NOTE — Progress Notes (Addendum)
1025 Dr Ailene Ards updated on pts bp's and vaginal spottingx1  from last night. Orders received for 24hr urine to be started.   1400 24 hr urine start at 1145.

## 2012-05-25 LAB — PROTEIN, URINE, 24 HR
Period of collection: 24 hr
Protein,urine 24 hr: 352 mg/24hr — ABNORMAL HIGH (ref <149)
Volume: 1850 mL

## 2012-05-25 LAB — GLUCOSE, POC
Glucose (POC): 85 mg/dL (ref 65–105)
Glucose (POC): 96 mg/dL (ref 65–105)
Glucose (POC): 99 mg/dL (ref 65–105)
Glucose (POC): 96 mg/dL (ref 65–105)

## 2012-05-25 LAB — CREATININE, UR, 24 HR
Creatinine, urine 24 hr: 1766 MG/24HR (ref 600–2500)
Period of collection: 24 hr
Total volume, urine: 1850 mL

## 2012-05-25 MED ORDER — LABETALOL 100 MG TAB
100 mg | ORAL_TABLET | Freq: Two times a day (BID) | ORAL | Status: DC
Start: 2012-05-25 — End: 2012-07-19

## 2012-05-25 MED ADMIN — diphenhydrAMINE (BENADRYL) 25 mg capsule: @ 03:00:00 | NDC 00904530660

## 2012-05-25 MED ADMIN — nystatin (MYCOSTATIN) 100,000 unit/gram powder: TOPICAL | NDC 68308015215

## 2012-05-25 MED ADMIN — labetalol (NORMODYNE) tablet 100 mg: ORAL | @ 13:00:00 | NDC 00172436460

## 2012-05-25 MED ADMIN — labetalol (NORMODYNE) tablet 100 mg: ORAL | @ 02:00:00 | NDC 00172436460

## 2012-05-25 MED ADMIN — nystatin (MYCOSTATIN) 100,000 unit/gram powder: TOPICAL | @ 14:00:00 | NDC 68308015215

## 2012-05-25 MED FILL — LABETALOL 100 MG TAB: 100 mg | ORAL | Qty: 1

## 2012-05-25 MED FILL — DIPHENHYDRAMINE 25 MG CAP: 25 mg | ORAL | Qty: 1

## 2012-05-25 NOTE — Progress Notes (Signed)
Verbal shift change report given to Susan Beardsley, RN (oncoming nurse) by Nichole Britt, RN (offgoing nurse).  Report given with SBAR, MAR and Recent Results.

## 2012-05-25 NOTE — Progress Notes (Signed)
S - seen by diabetic teaching.  No HA, visual changes, CP, SOB, abd pain, swelling.  Good FM  O - BPs mainly 120s/70s, occ SBP in 140s    24hr urine=352mg  total protein    A&P - Will discharge home on bedrest.          - Labetalol BID          - RTO 1 wk, turn in 24hr urine          -Pre-e prec

## 2012-05-25 NOTE — Other (Signed)
DTC Consult Note    Recommendations/ Comments: Pt has been instructed on BG meter and GDM meal plan.  I gave her a mail order kit for her meter- they will contact MD for Rx for testing supplies.  She is scheduled to see me OP on 06/01/12 and will likely need an insurance authorization for OP diabetes education, which MD office will need to get.     Consult received for:  []              Assessment of home management     [x]              Meter/monitoring     []              Insulin instruction     [x]              New GDM diagnosis     [x]              Outpatient education     []              Insulin pump patient     []              Insulin infusion     []              DKA/HHS    Chart reviewed and initial evaluation complete on [employer].    Patient is a 46 y.o. female with newly diagnosed GDM. She states no family history of T2DM or GDM in her previous 4 pregnancies.    Pt will start BG monitoring at home 4 times per day- fasting and 2 hours pp per MD request.  I taught her a One Touch Ultra 2 meter.  She was able to perform on test on herself and her BG 1.5 hr after lunch today was 126 mg/dl.    Assessed and instructed patient on the following: .     ??  interpretation of lab results and dx of GDM  ??  blood sugar goals (<95 Fasting; <120 2 hr pp)  ??  exercise  ?? SMBG skills- pt was able to demonstrate proper use of meter  ??  nutrition- pt will have to eliminate juice and cereal for breakfast and not eat fruit before noon  ??  referred to Diabetes Educator    Encouraged the following:   ?? increased exercise- if no restrictions by MD  ??  dietary modifications: reviewed GDM meal plan, pt was able to grasp concepts.  Encouraged her to add a bedtime snack  ??  regular blood sugar monitoring: 4 times daily- fasting and 2 hr pp    Provided patient with the following: [x]              GDM education materials               []              Insulin education materials               []              CHO counting education materials                [x]              Outpatient DTC contact number               [x]              Glucometer               []   Insulin start kit- vial/syringe               []              Insulin start kit- pen    Patient was able to give return demonstration of [x]        glucometer []        saline injection with []       no/ [x]        minimal assistance needed.  []        Nurse to have patient self inject prior to discharge.    A1c:   No results found for this basename: HBA1C, HGBE8       POC Glucose last 24hrs:   Lab Results   Component Value Date/Time    POC GLUCOSE 99 05/25/2012 12:03 PM    POC GLUCOSE 96 05/25/2012  8:22 AM    POC GLUCOSE 85 05/24/2012  9:32 PM    POC GLUCOSE 99 05/24/2012  3:54 PM    POC GLUCOSE 105 05/24/2012 11:40 AM    POC GLUCOSE 103 05/24/2012  8:06 AM    POC GLUCOSE 103 05/23/2012  8:51 PM    POC GLUCOSE 104 05/23/2012  4:44 PM       Lab Results   Component Value Date/Time    Creatinine 0.56 05/19/2012  1:30 PM       Current hospital DM medication: none     Will continue to follow as needed.    Thank you.  Marciel Offenberger K. Fulton Mole, RD, CDE

## 2012-05-25 NOTE — Progress Notes (Signed)
Antepartum Obstetrics Progress Note    Name: Julie Daniels MRN: [id number]  SSN: [id number]    Date of Birth: 1966/10/08  Age: 46 y.o.  Sex: female      Subjective:      LOS: 6 days    Estimated Date of Delivery: 08/08/12   Gestational Age Today: [redacted]w[redacted]d     Patient admitted for hypertension. States she does have normal fetal movement and does not have abdominal pain  , chest pain, contractions, headache , nausea and vomiting, pelvic pressure, right upper quadrant pain  , shortness of breath, swelling, vaginal leaking of fluid  and visual disturbances.    Objective:     Vitals:  Blood pressure 127/68, pulse 85, temperature 98.3 ??F (36.8 ??C), resp. rate 24, height 5\' 6"  (1.676 m), weight 110.678 kg (244 lb).   Temp (24hrs), Avg:98.2 ??F (36.8 ??C), Min:98.1 ??F (36.7 ??C), Max:98.3 ??F (36.8 ??C)    Systolic (24hrs), Avg:137 mmHg, Min:118 mmHg, Max:163 mmHg     Diastolic (24hrs), Avg:68 mmHg, Min:54 mmHg, Max:81 mmHg       Intake and Output:          Physical Exam:  Patient without distress.  Heart: Regular rate and rhythm or S1S2 present  Lung: clear to auscultation throughout lung fields, no wheezes, no rales, no rhonchi and normal respiratory effort  Abdomen: soft, nontender  Fundus: soft and non tender  Lower Extremities:  - Edema No   - No evidence of DVT seen on physical exam.  Negative Homan's sign.  No cords or calf tenderness.   - Patellar Reflexes: 1+ bilaterally   - Clonus: absent       Membranes:  Intact    Uterine Activity:  None    Fetal Heart Rate:  Baseline: 150s per minute        Labs:   Recent Results (from the past 36 hour(s))   GLUCOSE, POC    Collection Time    05/24/12  8:06 AM       Component Value Range    POC GLUCOSE 103  65 - 105 mg/dL    Performed by Melford Aase     GLUCOSE, POC    Collection Time    05/24/12 11:40 AM       Component Value Range    POC GLUCOSE 105  65 - 105 mg/dL    Performed by Melford Aase     PROTEIN, URINE, 24 HR    Collection Time    05/24/12 11:45 AM       Component Value  Range    Period of collection 24      Volume 1850      Protein,urine 24 hr 352 (*) <149 mg/24hr   GLUCOSE, POC    Collection Time    05/24/12  3:54 PM       Component Value Range    POC GLUCOSE 99  65 - 105 mg/dL    Performed by Kirstie Peri     GLUCOSE, POC    Collection Time    05/24/12  9:32 PM       Component Value Range    POC GLUCOSE 85  65 - 105 mg/dL    Performed by Serita Kyle     GLUCOSE, POC    Collection Time    05/25/12  8:22 AM       Component Value Range    POC GLUCOSE 96  65 - 105 mg/dL    Performed by  Phoebe Sharps     CREATININE, UR, 24 HR    Collection Time    05/25/12 11:45 AM       Component Value Range    Period of collection 24      Volume 1850      Creatinine,urine 24 hr 1766  600 - 2500 MG/24HR   GLUCOSE, POC    Collection Time    05/25/12 12:03 PM       Component Value Range    POC GLUCOSE 99  65 - 105 mg/dL    Performed by Phoebe Sharps     GLUCOSE, POC    Collection Time    05/25/12  3:02 PM       Component Value Range    POC GLUCOSE 96  65 - 105 mg/dL    Performed by Phoebe Sharps         Assessment and Plan:      Active Problems:   * No active hospital problems. *      Hypertension - CHTN, mild preeclampsia  - BPs much improved on labetalol 100mg  BID  - 24hr urine shows stable proteinuria=324mg   - OK to d/c home as per MFM.  Bedrest.  Continue labetalol 100mg  BID.  Can monitor BPs at home.  Will need to return to hospital if SBPs >/= 160, DBP >/= 100  -preeclampsia precautions reviewed    A1DM, newly diagnosed.  Seen by diabetic educator earlier today  - monitor glucose per protocol  - bring readings to appt  - [facility]    Signed By: Ileana Ladd, MD     May 25, 2012

## 2012-05-28 ENCOUNTER — Inpatient Hospital Stay: Payer: MEDICAID

## 2012-05-28 NOTE — Progress Notes (Signed)
0100- no blood visualized on peri pad after 3 hours, no contractions noted on fetal monitor.  71- Spoke with Dr. Zettie Cooley on phone regarding patient status, BPs 119/63 last documented, no vaginal bleeding, orders to discharge pt home.  6045- Discharge instructions given to pt. Patient verbalizes understanding. No other concerns raised by patient.

## 2012-05-28 NOTE — Progress Notes (Signed)
0133- pt ambulated off unit with significant other.

## 2012-05-28 NOTE — H&P (Signed)
Pt is a 46 y.o.female.[id number] para2 The patient presents with complaint of vaginal spotting with wiping.  The patient denies LOF, contracitons, N/V/F/C.  Pt reports good fetal movement.  Pregnancy uncomplicated.    Past Medical History   Diagnosis Date   ??? Essential hypertension        Visit Vitals   Item Reading   ??? BP 119/63   ??? Pulse 67   ??? Temp 98.4 ??F (36.9 ??C)   ??? Resp 20   ??? Ht 5\' 6"  (1.676 m)   ??? Wt 110.678 kg (244 lb)   ??? BMI 39.38 kg/m2   ??? SpO2 99%       No data found.           EXAM:  Cervical Exam:  Unchanged from previous exam  Membranes:  Intact  Uterine Activity: None  Fetal Heart Rate: Baseline: 135 per minute          ASSESSMENT:    IUP @ 29 weeks  No evidence of vaginal bleeding        PLAN:   Discharge home  Preterm labor precautions reviewed

## 2012-06-02 ENCOUNTER — Encounter

## 2012-06-24 NOTE — Discharge Summary (Signed)
Name:       Julie Daniels, Julie Daniels                 Admitted:    05/25/2012                                               Discharged:  05/25/2012  Account #:  [id number]                     DOB:         09-Apr-1966  Consultant: Ileana Ladd, MD                Age          46                                 DISCHARGE SUMMARY      ADMISSION DIAGNOSES:  1. Intrauterine pregnancy at 46 weeks 3 days' gestation.  2. Hypertension, chronic versus preeclampsia.    DISCHARGE DIAGNOSES:  1. Intrauterine pregnancy at 74 weeks' gestation.  2. Chronic hypertension.  3. Mild preeclampsia.  4. Gestational diabetes.  5. Polyhydramnios.    HISTORY OF PRESENT ILLNESS: This is a 46 year old gravida 4, para 2-1-0-3  at 46 weeks 3 days' gestation. She was seen in the office for a routine OB  visit and was noted to have elevated blood pressures, the first 150/80, a  repeat 160/82. She was directed to Labor and Delivery for further  evaluation. Initial blood work was normal and she was admitted for  collection of a 24-hour urine and serial blood pressures.    HOSPITAL COURSE: The patient was admitted and placed on bedrest. A 24-hour  urine was initiated and maternal-fetal medicine consult placed. Her blood  pressures initially improved somewhat; however, they did continue to be  somewhat labile. Her 24-hour urine returned with 324 mg of protein  (baseline 24-hour urine collected early in the pregnancy had returned with  202 mg total protein. She was seen by Maternal-Fetal Medicine, who felt  that her blood pressures were elevated at least in part to chronic  hypertension. Recommendation was to start labetalol 100 mg b.i.d. and  continue to monitor blood pressures. However, they recommended not  increasing her medication if this was ineffective. Ultrasound showed  polyhydramnios with an AFI of 30.3. They also recommended that a 3 hour GTT  be performed.    The labetalol was initiated, and a 3 hour GTT performed. These results did  return  consistent with gestational diabetes with a fasting of 94, 1 hour of  170, a 2 hour of 156, and a 3 hour of 144.    A diabetes teaching consult was placed. She was started on a diabetic diet.  Her blood pressures did improve on labetalol and drop down to the 120s over  70s. As per MFM recommendations, she was discharged home on bedrest.    DISCHARGE MEDICATIONS: Labetalol 100 mg p.o. b.i.d.    DIET: Diabetic diet.    ACTIVITY: Bedrest.    FOLLOWUP: In the office in 1 week with a 24 hour urine collection.    CONDITION: Good.    DISPOSITION: Home.        Reviewed on 06/24/2012 12:32 PM  Ileana Ladd, MD    cc:    Ileana Ladd, MD      DMM/wmx; D: 06/23/2012 04:32 P; T: 06/24/2012 11:35 A; DOC# [id number]; Job#  [id number]

## 2012-06-30 ENCOUNTER — Inpatient Hospital Stay: Payer: MEDICAID

## 2012-06-30 LAB — CBC WITH AUTOMATED DIFF
ABS. BASOPHILS: 0 10*3/uL (ref 0.0–0.1)
ABS. EOSINOPHILS: 0.6 10*3/uL — ABNORMAL HIGH (ref 0.0–0.4)
ABS. LYMPHOCYTES: 1.9 10*3/uL (ref 0.8–3.5)
ABS. MONOCYTES: 0.8 10*3/uL (ref 0.0–1.0)
ABS. NEUTROPHILS: 10.6 10*3/uL — ABNORMAL HIGH (ref 1.8–8.0)
BASOPHILS: 0 % (ref 0–1)
EOSINOPHILS: 4 % (ref 0–7)
HCT: 38.8 % (ref 35.0–47.0)
HGB: 13.2 g/dL (ref 11.5–16.0)
LYMPHOCYTES: 14 % (ref 12–49)
MCH: 26.7 PG (ref 26.0–34.0)
MCHC: 34 g/dL (ref 30.0–36.5)
MCV: 78.5 FL — ABNORMAL LOW (ref 80.0–99.0)
MONOCYTES: 6 % (ref 5–13)
NEUTROPHILS: 76 % — ABNORMAL HIGH (ref 32–75)
PLATELET: 184 10*3/uL (ref 150–400)
RBC: 4.94 M/uL (ref 3.80–5.20)
RDW: 18.4 % — ABNORMAL HIGH (ref 11.5–14.5)
WBC: 13.9 10*3/uL — ABNORMAL HIGH (ref 3.6–11.0)

## 2012-06-30 LAB — METABOLIC PANEL, COMPREHENSIVE
A-G Ratio: 0.6 — ABNORMAL LOW (ref 1.1–2.2)
ALT (SGPT): 19 U/L (ref 12–78)
AST (SGOT): 16 U/L (ref 15–37)
Albumin: 2.7 g/dL — ABNORMAL LOW (ref 3.5–5.0)
Alk. phosphatase: 102 U/L (ref 50–136)
Anion gap: 11 mmol/L (ref 5–15)
BUN/Creatinine ratio: 23 — ABNORMAL HIGH (ref 12–20)
BUN: 15 MG/DL (ref 6–20)
Bilirubin, total: 0.3 MG/DL (ref 0.2–1.0)
CO2: 24 MMOL/L (ref 21–32)
Calcium: 8.9 MG/DL (ref 8.5–10.1)
Chloride: 103 MMOL/L (ref 97–108)
Creatinine: 0.65 MG/DL (ref 0.45–1.15)
GFR est AA: >60 mL/min/{1.73_m2} (ref >60)
GFR est non-AA: >60 mL/min/{1.73_m2} (ref >60)
Globulin: 4.2 g/dL — ABNORMAL HIGH (ref 2.0–4.0)
Glucose: 94 MG/DL (ref 65–100)
Potassium: 3.9 MMOL/L (ref 3.5–5.1)
Protein, total: 6.9 g/dL (ref 6.4–8.2)
Sodium: 138 MMOL/L (ref 136–145)

## 2012-06-30 LAB — LD: LD: 168 U/L (ref 81–246)

## 2012-06-30 LAB — PROTEIN, URINE, 24 HR
Period of collection: 24 hr
Protein,urine 24 hr: 510 mg/24hr — ABNORMAL HIGH (ref <149)
Volume: 2550 mL

## 2012-06-30 LAB — URIC ACID: Uric acid: 3.7 MG/DL (ref 2.6–6.0)

## 2012-06-30 NOTE — Progress Notes (Addendum)
1345 Spoke to Dr Chopra Basil, labs reviewed, NST reactive, BP's reviewed. LD and uric acid not resulted yet. Orders to discharge to home at this time with pre eclampsia precautions and to keep follow up appt for next week in the office. Also to bring 24 hour urine to next office visit. Read back.

## 2012-07-07 ENCOUNTER — Inpatient Hospital Stay: Payer: MEDICAID

## 2012-07-07 LAB — METABOLIC PANEL, COMPREHENSIVE
A-G Ratio: 0.7 — ABNORMAL LOW (ref 1.1–2.2)
ALT (SGPT): 18 U/L (ref 12–78)
AST (SGOT): 21 U/L (ref 15–37)
Albumin: 2.6 g/dL — ABNORMAL LOW (ref 3.5–5.0)
Alk. phosphatase: 105 U/L (ref 50–136)
Anion gap: 11 mmol/L (ref 5–15)
BUN/Creatinine ratio: 28 — ABNORMAL HIGH (ref 12–20)
BUN: 12 MG/DL (ref 6–20)
Bilirubin, total: 0.3 MG/DL (ref 0.2–1.0)
CO2: 22 MMOL/L (ref 21–32)
Calcium: 8.7 MG/DL (ref 8.5–10.1)
Chloride: 104 MMOL/L (ref 97–108)
Creatinine: 0.43 MG/DL — ABNORMAL LOW (ref 0.45–1.15)
GFR est AA: >60 mL/min/{1.73_m2} (ref >60)
GFR est non-AA: >60 mL/min/{1.73_m2} (ref >60)
Globulin: 3.8 g/dL (ref 2.0–4.0)
Glucose: 91 MG/DL (ref 65–100)
Potassium: 3.9 MMOL/L (ref 3.5–5.1)
Protein, total: 6.4 g/dL (ref 6.4–8.2)
Sodium: 137 MMOL/L (ref 136–145)

## 2012-07-07 LAB — CREATININE, UR, 24 HR
Creatinine, urine 24 hr: 1520 MG/24HR (ref 600–2500)
Period of collection: 24 hr
Total volume, urine: 2200 mL

## 2012-07-07 LAB — CBC WITH AUTOMATED DIFF
ABS. BASOPHILS: 0 10*3/uL (ref 0.0–0.1)
ABS. EOSINOPHILS: 0.5 10*3/uL — ABNORMAL HIGH (ref 0.0–0.4)
ABS. LYMPHOCYTES: 1.9 10*3/uL (ref 0.8–3.5)
ABS. MONOCYTES: 0.6 10*3/uL (ref 0.0–1.0)
ABS. NEUTROPHILS: 8.9 10*3/uL — ABNORMAL HIGH (ref 1.8–8.0)
BASOPHILS: 0 % (ref 0–1)
EOSINOPHILS: 4 % (ref 0–7)
HCT: 37.9 % (ref 35.0–47.0)
HGB: 13.1 g/dL (ref 11.5–16.0)
LYMPHOCYTES: 16 % (ref 12–49)
MCH: 27.1 PG (ref 26.0–34.0)
MCHC: 34.6 g/dL (ref 30.0–36.5)
MCV: 78.5 FL — ABNORMAL LOW (ref 80.0–99.0)
MONOCYTES: 5 % (ref 5–13)
NEUTROPHILS: 75 % (ref 32–75)
PLATELET: 176 10*3/uL (ref 150–400)
RBC: 4.83 M/uL (ref 3.80–5.20)
RDW: 17.4 % — ABNORMAL HIGH (ref 11.5–14.5)
WBC: 12 10*3/uL — ABNORMAL HIGH (ref 3.6–11.0)

## 2012-07-07 LAB — PROTEIN, URINE, 24 HR
Period of collection: 24 hr
Protein,urine 24 hr: 638 mg/24hr — ABNORMAL HIGH (ref <149)
Volume: 2200 mL

## 2012-07-07 LAB — LD: LD: 178 U/L (ref 81–246)

## 2012-07-07 LAB — PTT: aPTT: 30.6 s — ABNORMAL HIGH (ref 23.0–30.0)

## 2012-07-07 LAB — URIC ACID: Uric acid: 3.6 MG/DL (ref 2.6–6.0)

## 2012-07-07 LAB — PROTHROMBIN TIME + INR
INR: 1 (ref 0.9–1.1)
Prothrombin time: 10.5 s (ref 9.4–11.7)

## 2012-07-07 NOTE — Progress Notes (Addendum)
1145 Dr Banales Basil at nurses station reviewed strip requested some more monitoring at this time  1338 spoke with Dr Porter Basil regarding pt status, labs reviewed and FHR status given, verbal order to discharge home received at this time  1350 discharge instructions reviewed with pt and copy given to her

## 2012-07-07 NOTE — Progress Notes (Signed)
1250: Pt called out for help, assisted pt to the restroom at this time.

## 2012-07-14 ENCOUNTER — Inpatient Hospital Stay: Payer: MEDICAID

## 2012-07-14 LAB — METABOLIC PANEL, COMPREHENSIVE
A-G Ratio: 0.7 — ABNORMAL LOW (ref 1.1–2.2)
ALT (SGPT): 21 U/L (ref 12–78)
AST (SGOT): 30 U/L (ref 15–37)
Albumin: 2.8 g/dL — ABNORMAL LOW (ref 3.5–5.0)
Alk. phosphatase: 114 U/L (ref 50–136)
Anion gap: 16 mmol/L — ABNORMAL HIGH (ref 5–15)
BUN/Creatinine ratio: 29 — ABNORMAL HIGH (ref 12–20)
BUN: 15 MG/DL (ref 6–20)
Bilirubin, total: 0.3 MG/DL (ref 0.2–1.0)
CO2: 20 MMOL/L — ABNORMAL LOW (ref 21–32)
Calcium: 9 MG/DL (ref 8.5–10.1)
Chloride: 102 MMOL/L (ref 97–108)
Creatinine: 0.51 MG/DL (ref 0.45–1.15)
GFR est AA: >60 mL/min/{1.73_m2} (ref >60)
GFR est non-AA: >60 mL/min/{1.73_m2} (ref >60)
Globulin: 4 g/dL (ref 2.0–4.0)
Glucose: 94 MG/DL (ref 65–100)
Potassium: 3.9 MMOL/L (ref 3.5–5.1)
Protein, total: 6.8 g/dL (ref 6.4–8.2)
Sodium: 138 MMOL/L (ref 136–145)

## 2012-07-14 LAB — PROTEIN, URINE, 24 HR
Period of collection: 24 hr
Protein,urine 24 hr: 638 mg/24hr — ABNORMAL HIGH (ref <149)
Volume: 2200 mL

## 2012-07-14 LAB — CBC WITH AUTOMATED DIFF
ABS. BASOPHILS: 0 10*3/uL (ref 0.0–0.1)
ABS. EOSINOPHILS: 0.5 10*3/uL — ABNORMAL HIGH (ref 0.0–0.4)
ABS. LYMPHOCYTES: 2.1 10*3/uL (ref 0.8–3.5)
ABS. MONOCYTES: 0.8 10*3/uL (ref 0.0–1.0)
ABS. NEUTROPHILS: 9.8 10*3/uL — ABNORMAL HIGH (ref 1.8–8.0)
BASOPHILS: 0 % (ref 0–1)
EOSINOPHILS: 4 % (ref 0–7)
HCT: 39.6 % (ref 35.0–47.0)
HGB: 13.6 g/dL (ref 11.5–16.0)
LYMPHOCYTES: 16 % (ref 12–49)
MCH: 27.1 PG (ref 26.0–34.0)
MCHC: 34.3 g/dL (ref 30.0–36.5)
MCV: 78.9 FL — ABNORMAL LOW (ref 80.0–99.0)
MONOCYTES: 6 % (ref 5–13)
NEUTROPHILS: 74 % (ref 32–75)
PLATELET: 161 10*3/uL (ref 150–400)
RBC: 5.02 M/uL (ref 3.80–5.20)
RDW: 17.1 % — ABNORMAL HIGH (ref 11.5–14.5)
WBC: 13.2 10*3/uL — ABNORMAL HIGH (ref 3.6–11.0)

## 2012-07-14 LAB — CREATININE, UR, 24 HR
Creatinine, urine 24 hr: 1529 MG/24HR (ref 600–2500)
Period of collection: 24 hr
Total volume, urine: 2200 mL

## 2012-07-14 LAB — URIC ACID: Uric acid: 4.1 MG/DL (ref 2.6–6.0)

## 2012-07-14 LAB — LD: LD: 156 U/L (ref 81–246)

## 2012-07-14 NOTE — Progress Notes (Signed)
1424 Spoke to Dr Blakeman Basil, labs reviewed, orders to discharge to home at this time, and continue plan for c section Friday. Read back.

## 2012-07-14 NOTE — Progress Notes (Signed)
Provided patient with discharge instructions. Confirmed pt understanding of her induction scheduled on Friday.  Pt verbalized understanding of all education.

## 2012-07-17 ENCOUNTER — Inpatient Hospital Stay
Admit: 2012-07-17 | Discharge: 2012-07-20 | Disposition: A | Discharge status: Home / Self Care | Payer: MEDICAID | Attending: Obstetrics & Gynecology | Admitting: Obstetrics & Gynecology

## 2012-07-17 DIAGNOSIS — O34219 Maternal care for unspecified type scar from previous cesarean delivery: Secondary | ICD-10-CM

## 2012-07-17 LAB — METABOLIC PANEL, COMPREHENSIVE
A-G Ratio: 0.7 — ABNORMAL LOW (ref 1.1–2.2)
ALT (SGPT): 21 U/L (ref 12–78)
AST (SGOT): 21 U/L (ref 15–37)
Albumin: 2.7 g/dL — ABNORMAL LOW (ref 3.5–5.0)
Alk. phosphatase: 115 U/L (ref 50–136)
Anion gap: 12 mmol/L (ref 5–15)
BUN/Creatinine ratio: 27 — ABNORMAL HIGH (ref 12–20)
BUN: 15 MG/DL (ref 6–20)
Bilirubin, total: 0.3 MG/DL (ref 0.2–1.0)
CO2: 22 MMOL/L (ref 21–32)
Calcium: 9 MG/DL (ref 8.5–10.1)
Chloride: 103 MMOL/L (ref 97–108)
Creatinine: 0.56 MG/DL (ref 0.45–1.15)
GFR est AA: >60 mL/min/{1.73_m2} (ref >60)
GFR est non-AA: >60 mL/min/{1.73_m2} (ref >60)
Globulin: 4 g/dL (ref 2.0–4.0)
Glucose: 96 MG/DL (ref 65–100)
Potassium: 3.8 MMOL/L (ref 3.5–5.1)
Protein, total: 6.7 g/dL (ref 6.4–8.2)
Sodium: 137 MMOL/L (ref 136–145)

## 2012-07-17 LAB — CBC WITH AUTOMATED DIFF
ABS. BASOPHILS: 0 10*3/uL (ref 0.0–0.1)
ABS. EOSINOPHILS: 0.7 10*3/uL — ABNORMAL HIGH (ref 0.0–0.4)
ABS. LYMPHOCYTES: 2.7 10*3/uL (ref 0.8–3.5)
ABS. MONOCYTES: 0.8 10*3/uL (ref 0.0–1.0)
ABS. NEUTROPHILS: 10.2 10*3/uL — ABNORMAL HIGH (ref 1.8–8.0)
BASOPHILS: 0 % (ref 0–1)
EOSINOPHILS: 5 % (ref 0–7)
HCT: 39.3 % (ref 35.0–47.0)
HGB: 13.5 g/dL (ref 11.5–16.0)
LYMPHOCYTES: 19 % (ref 12–49)
MCH: 27.3 PG (ref 26.0–34.0)
MCHC: 34.4 g/dL (ref 30.0–36.5)
MCV: 79.4 FL — ABNORMAL LOW (ref 80.0–99.0)
MONOCYTES: 6 % (ref 5–13)
NEUTROPHILS: 70 % (ref 32–75)
PLATELET: 180 10*3/uL (ref 150–400)
RBC: 4.95 M/uL (ref 3.80–5.20)
RDW: 16.9 % — ABNORMAL HIGH (ref 11.5–14.5)
WBC: 14.4 10*3/uL — ABNORMAL HIGH (ref 3.6–11.0)

## 2012-07-17 LAB — GYN RAPID GP B STREP

## 2012-07-17 MED ADMIN — ceFAZolin in 0.9% NS (ANCEF) IVPB Soln 2 g: INTRAVENOUS | @ 12:00:00 | NDC 09999966850

## 2012-07-17 MED ADMIN — lactated ringers infusion: INTRAVENOUS | @ 14:00:00 | NDC 00409795309

## 2012-07-17 MED ADMIN — diphenhydrAMINE (BENADRYL) injection 12.5 mg: INTRAVENOUS | @ 14:00:00 | NDC 63323066401

## 2012-07-17 MED ADMIN — oxyCODONE-acetaminophen (PERCOCET) 5-325 mg per tablet 1 Tab: ORAL | @ 23:00:00 | NDC 68084035511

## 2012-07-17 MED ADMIN — lactated ringers infusion 1,000 mL: INTRAVENOUS | @ 10:00:00 | NDC 00338011704

## 2012-07-17 MED ADMIN — measles, mumps & rubella Vacc (PF) (M-M-R II) injection 0.5 mL: SUBCUTANEOUS | @ 18:00:00 | NDC 00006468100

## 2012-07-17 MED ADMIN — morphine (PF) PCA 150 mg/30 ml: INTRAVENOUS | @ 15:00:00 | NDC 00409602804

## 2012-07-17 MED ADMIN — lactated ringers infusion 1,000 mL: INTRAVENOUS | @ 11:00:00 | NDC 00338011704

## 2012-07-17 MED ADMIN — labetalol (NORMODYNE) tablet 100 mg: ORAL | @ 23:00:00 | NDC 00172436460

## 2012-07-17 MED ADMIN — ketorolac (TORADOL) injection 30 mg: INTRAVENOUS | @ 21:00:00 | NDC 64679075801

## 2012-07-17 MED FILL — BOOSTRIX TDAP 2.5 LF UNIT-8 MCG-5 LF/0.5 ML INTRAMUSCULAR SYRINGE: INTRAMUSCULAR | Qty: 0.5

## 2012-07-17 MED FILL — MORPHINE (PF) 150 MG/30 ML CONCENTRATED INFUSION: 150 mg/30 mL | INTRAVENOUS | Qty: 30

## 2012-07-17 MED FILL — KETOROLAC TROMETHAMINE 30 MG/ML INJECTION: 30 mg/mL (1 mL) | INTRAMUSCULAR | Qty: 1

## 2012-07-17 MED FILL — LACTATED RINGERS IV: INTRAVENOUS | Qty: 1000

## 2012-07-17 MED FILL — LABETALOL 100 MG TAB: 100 mg | ORAL | Qty: 1

## 2012-07-17 MED FILL — EPHEDRINE SULFATE 50 MG/ML IJ SOLN: 50 mg/mL | INTRAMUSCULAR | Qty: 1

## 2012-07-17 MED FILL — FENTANYL CITRATE (PF) 50 MCG/ML IJ SOLN: 50 mcg/mL | INTRAMUSCULAR | Qty: 2

## 2012-07-17 MED FILL — CEFAZOLIN 2 GRAM/50 ML NS IVPB: INTRAVENOUS | Qty: 50

## 2012-07-17 MED FILL — KETAMINE 10 MG/ML IJ SOLN: 10 mg/mL | INTRAMUSCULAR | Qty: 20

## 2012-07-17 MED FILL — OXYCODONE-ACETAMINOPHEN 5 MG-325 MG TAB: 5-325 mg | ORAL | Qty: 1

## 2012-07-17 MED FILL — MIDAZOLAM 1 MG/ML IJ SOLN: 1 mg/mL | INTRAMUSCULAR | Qty: 2

## 2012-07-17 MED FILL — M-M-R II (PF) 1,000-12,500 TCID50/0.5 ML SUBCUTANEOUS SOLUTION: 1000-12500 TCID50/0.5 mL | SUBCUTANEOUS | Qty: 1

## 2012-07-17 MED FILL — OXYTOCIN 10 UNIT/ML INJECTION: 10 unit/mL | INTRAMUSCULAR | Qty: 4

## 2012-07-17 MED FILL — DIPHENHYDRAMINE HCL 50 MG/ML IJ SOLN: 50 mg/mL | INTRAMUSCULAR | Qty: 1

## 2012-07-17 MED FILL — MORPHINE (PF) 0.5 MG/ML IJ SOLN: 0.5 mg/mL | INTRAMUSCULAR | Qty: 10

## 2012-07-17 MED FILL — ONDANSETRON (PF) 4 MG/2 ML INJECTION: 4 mg/2 mL | INTRAMUSCULAR | Qty: 2

## 2012-07-17 MED FILL — NALOXONE 0.4 MG/ML INJECTION: 0.4 mg/mL | INTRAMUSCULAR | Qty: 1

## 2012-07-17 NOTE — Op Note (Signed)
Cesarean Section Delivery Procedure Note         Name: Julie Daniels      Medical Record Number: [id number]      Date of Birth: 18-Mar-1966     Today's Date: July 17, 2012      Preoperative Diagnosis:  Previous C/S with vertical extension into upper fundus, not OK for TOLAC                                              A1DM                                              CHTN                                              Mild preeclampsia, superimposed     Postoperative Diagnosis: same    Procedure: Low Cervical Transverse Procedure(s):  CESAREAN SECTION    Surgeon(s):  Dondre Catalfamo Estrellita Ludwig, MD    Anesthesia: Spinal    EBL:  500    Prophylactic Antibiotics: Ancef         Fetal Description: singleton female    Birth Information:   Information for the patient's newborn:  Stroh, Female [[id number]]          Umbilical Cord: 3 vessels present    Placenta:  manual removal    Specimens: * No specimens in log *            Complications:  none    Procedure Details:    The patient was taken to the operating room, where spinal anesthesia was administered and found to be adequate. The patient was placed in a left-tilt position, the pannus taped,  and prepped and draped in the normal sterile fashion, including placement of a foley catheter.      A knife was used to incise the skin in a Pfannenstiel fashion.    Bovie cautery was used to carry the incision through the subcutaneous tissue and for point hemostasis.  The fascia was knicked in the midline with the cautery.  The fascial incision was extended bilaterally with mayo scissors.  The fascial edges were grasped with Kocher clamps, and blunt, sharp, and cautery dissection used to dissect the fascia from the underlying muscle bellies.  The muscle bellies were bluntly divided and pushed aside.   The peritoneum was entered bluntly and the peritoneal incision stretched.  Cautery was used to further divide the muscles in the midline in a cephalad and caudal direction with care to avoid injury  to the bladder and underlying structures.  Some adhesions were noted between the right upper anterior uterine wall and the anterior abdominal wall.  Because of this, an Alexis self-retaining retractor was not used.  A bladder flap was created with blunt and sharp dissection with Metzenbaum scissors and a bladder blade placed to retract the bladder.     A low transverse uterine incision was made with the scalpel and developed by applying traction in a cephalad and caudal direction.   Amniotomy was performed and the fluid was copius amount clear.  The  head was  elevated to the level of the incision and delivered without difficulty. The nose and mouth were bulb suctioned. The body was delivered, the cord was clamped and cut and the baby was handed off to the waiting neonatal care unit staff.  Attempt was made to deliver the placenta using gentle traction and fundal massage, however the cord avulsed when the placenta was only partially delivered.  The main portion of the placenta was grasped and delivered without difficulty.     Because of the adhesions, the uterus was not exteriorized.  The uterine cavity was cleared of all clots and debris. The uterine incision was closed with number 1 Monocryl  in two layers, the first a running locking fashion, the second an imbricating layer.  Some persistent oozing was noted inferior to the incision in the midline.  0-chromic was used to place figure-of-8 stitches x2.  Good hemostasis was confirmed.  The abdomen and pelvis were irrigated with warm normal saline. The uterine incision was again inspected and good hemostasis was again reassured.     The muscle bellies were inspected and good hemostasis confirmed The fascia was closed with 0 PDS in a running fashion.  The deep subcutaneous tissue was reapproximated with 3.0 plain gut in a running fashion.  The skin was closed with Insorb skin staples.  Steristrips were applied.  The patient tolerated the procedure well. Sponge, lap,  and needle counts were correct times three and the patient and baby were taken to recovery/postpartum room in stable condition.    Signed: Ileana Ladd, MD      July 17, 2012

## 2012-07-17 NOTE — Progress Notes (Signed)
Problem: Patient Education: Go to Patient Education Activity  Goal: Patient/Family Education  Outcome: Progressing Towards Goal    Reviewed effects/risks of late preterm birth on initiation of breastfeeding including infant's sleepiness, ineffective or missed breastfeedings, infant's decreased stamina to sustain prolonged latch and effective breastfeeding, decreased energy reserves related to low birth wt and inability to stimulate milk supply.   Recommended interventions include skin to skin prior to breastfeed, complement/supplement feeding and pumping  Per instructions.     Comments:   Pt will successfully establish breastfeeding by feeding in response to early feeding cues   or wake every 3h, will obtain deep latch, and will keep log of feedings/output.    Breast Assessment  Left Breast: Large  Left Nipple: Everted;Intact;Short  (Shield applied due to shallow latching)  Right Breast: Large  Right Nipple: Everted;Intact;Short   Breast- Feeding Assessment  Attends Breast-Feeding Classes: No (Experienced BF mom)  Breast-Feeding Experience: Yes  Breast Trauma/Surgery: No  Type/Quality: Good (Biological Nurturing tips/techniques shared.)  Lactation Consultant Visits  Breast-Feedings: Good   Mother/Infant Observation  Mother Observation: Alignment;Nipple round on release;Lets baby end feeding;Recognizes feeding cues;Sleepy after feeding  Infant Observation: Feeding cues;Lips flanged, lower;Rhythmic suck;Lips flanged, upper;Opens mouth;Relaxed after feeding  LATCH Documentation  Latch: Grasps breast, tongue down, lips flanged, rhythmic sucking  Audible Swallowing: A few with stimulation  Type of Nipple: Everted (after stimulation) (Rationale for use of shield in late preterm infant discussed)  Comfort (Breast/Nipple): Soft/non-tender  Hold (Positioning): Full assist, teach one side, mother does other, staff holds  [facility] Score: 8    Biological Nurturing breastfeeding principles taught.  How Biological Nurturing (BN)   promotes optimal breastfeeding (BF) sessions discussed.  Mother encouraged to seek comfortable semi-reclining breastfeeding positions.  Infant placed frontally along maternal contour.  Primitive innate feeding reflexes/behaviors of the newborn discussed. BN tips and techniques shared; assisted with comfortable breastfeeding positioning.

## 2012-07-17 NOTE — Op Note (Signed)
Cesarean Section Delivery Procedure Note         Name: Julie Daniels      Medical Record Number: [id number]      Date of Birth: 09/21/1966     Today's Date: July 17, 2012      Preoperative Diagnosis:  Previous C/S with vertical extension into upper fundus, not OK for TOLAC                                              A1DM                                              CHTN                                              Mild preeclampsia, superimposed     Postoperative Diagnosis: same    Procedure: Low Cervical Transverse Procedure(s):  CESAREAN SECTION    Surgeon(s):  Mckaylee Dimalanta M Maudine Kluesner, MD    Anesthesia: Spinal    EBL:  500    Prophylactic Antibiotics: Ancef         Fetal Description: singleton female    Birth Information:   Information for the patient's newborn:  Foree, Female [[id number]]          Umbilical Cord: 3 vessels present    Placenta:  manual removal    Specimens: * No specimens in log *            Complications:  none    Procedure Details:    The patient was taken to the operating room, where spinal anesthesia was administered and found to be adequate. The patient was placed in a left-tilt position, the pannus taped,  and prepped and draped in the normal sterile fashion, including placement of a foley catheter.      A knife was used to incise the skin in a Pfannenstiel fashion.    Bovie cautery was used to carry the incision through the subcutaneous tissue and for point hemostasis.  The fascia was knicked in the midline with the cautery.  The fascial incision was extended bilaterally with mayo scissors.  The fascial edges were grasped with Kocher clamps, and blunt, sharp, and cautery dissection used to dissect the fascia from the underlying muscle bellies.  The muscle bellies were bluntly divided and pushed aside.   The peritoneum was entered bluntly and the peritoneal incision stretched.  Cautery was used to further divide the muscles in the midline in a cephalad and caudal direction with care to avoid injury  to the bladder and underlying structures.  Some adhesions were noted between the right upper anterior uterine wall and the anterior abdominal wall.  Because of this, an Alexis self-retaining retractor was not used.  A bladder flap was created with blunt and sharp dissection with Metzenbaum scissors and a bladder blade placed to retract the bladder.     A low transverse uterine incision was made with the scalpel and developed by applying traction in a cephalad and caudal direction.   Amniotomy was performed and the fluid was copius amount clear.  The  head was   elevated to the level of the incision and delivered without difficulty. The nose and mouth were bulb suctioned. The body was delivered, the cord was clamped and cut and the baby was handed off to the waiting neonatal care unit staff.  Attempt was made to deliver the placenta using gentle traction and fundal massage, however the cord avulsed when the placenta was only partially delivered.  The main portion of the placenta was grasped and delivered without difficulty.     Because of the adhesions, the uterus was not exteriorized.  The uterine cavity was cleared of all clots and debris. The uterine incision was closed with number 1 Monocryl  in two layers, the first a running locking fashion, the second an imbricating layer.  Some persistent oozing was noted inferior to the incision in the midline.  0-chromic was used to place figure-of-8 stitches x2.  Good hemostasis was confirmed.  The abdomen and pelvis were irrigated with warm normal saline. The uterine incision was again inspected and good hemostasis was again reassured.     The muscle bellies were inspected and good hemostasis confirmed The fascia was closed with 0 PDS in a running fashion.  The deep subcutaneous tissue was reapproximated with 3.0 plain gut in a running fashion.  The skin was closed with Insorb skin staples.  Steristrips were applied.  The patient tolerated the procedure well. Sponge, lap,  and needle counts were correct times three and the patient and baby were taken to recovery/postpartum room in stable condition.    Signed: Meade Hogeland M Hareem Surowiec, MD      July 17, 2012

## 2012-07-17 NOTE — Progress Notes (Signed)
Verbal shift change report given to Cat Palmiter, RN (oncoming nurse) by Marrion Coy, RN (offgoing nurse).  Report given with SBAR, Kardex and MAR.

## 2012-07-17 NOTE — Progress Notes (Signed)
Verbal shift change report given to Marrion Coy, RN (oncoming nurse) by Velva Harman (offgoing nurse).  Report given with SBAR, Kardex, MAR and Recent Results.

## 2012-07-17 NOTE — H&P (Signed)
History & Physical    Name: Julie Daniels MRN: [id number]  SSN: [id number]    Date of Birth: [birth year]  Age: 46 y.o.  Sex: female        Subjective:     Estimated Date of Delivery: 08/08/12  OB History     Grav Para Term Preterm Abortions TAB SAB Ect Mult Living    6 4 2 2 1   1  3           Ms. Sison is admitted with pregnancy at [redacted]w[redacted]d for repeat Ceasarean section.  She has a h/o prior C/S with a vertical incision with extenstion into the upper fundus, per operative report.  Because of this timing of delivery, per MFM, has been based on recommendations as for h/o classical incision.  Prenatal course was complicated by chronic hypertension, diabetes - gestational, preeclampsia and polyhydramnios, late prenatal care, AMA with increase DSR=1:18 (declined invasive testing).     She presented for prenatal care @ 18wks, at which time her BPs were elevated 142/76, 138/72.  She denied any prior h/o CHTN, however, had not received regular medical care over the last several years.  She was admitted to L&D @ 28wks for worsening BPs.  24hr urine was elevated to 324mg .  MFM was consulted and she was started on Labetalol 100mg  BID which controlled her BPs.  She has continued this through the remainder of the pregnancy.      She was also noted to have polyhydramnios around this same time.  Previous 1hr GTT was nl.  However a 3hr GTT was performed at the recommendation of MFM.  This did return abnl, 94/170/155/144.  She has been managed with diet with good control other than some borderline elevated FBS in the upper 90s.    Her DSR is elevated 1:18.  She declined diagnostic testing.    Rubella equivocal.  Please see prenatal records for details.    Past Medical History   Diagnosis Date   ??? Essential hypertension    ??? Diabetes mellitus      gestational DM     Past Surgical History   Procedure Date   ??? Pr cesarean delivery only      1999   ??? Hx other surgical      ectopic pregnancy removal     Social History     Occupational  History   ??? Not on file.     Social History Main Topics   ??? Smoking status: Former Smoker     Quit date: 09/16/2003   ??? Smokeless tobacco: Never Used   ??? Alcohol Use: No   ??? Drug Use: No   ??? Sexually Active: Yes -- Female partner(s)     Family History   Problem Relation Age of Onset   ??? Cancer Mother    ??? Heart Disease Mother    ??? Heart Disease Father    ??? Hypertension Father    ??? Hypertension Brother        Allergies   Allergen Reactions   ??? Tape (Adhesive) Itching     Prior to Admission medications    Medication Sig Start Date End Date Taking? Authorizing Provider   ranitidine (ZANTAC) 150 mg tablet Take 150 mg by mouth two (2) times a day.   Yes Historical Provider   diphenhydrAMINE (BENADRYL) 25 mg capsule Take 25 mg by mouth every six (6) hours as needed.   Yes Historical Provider   labetalol (NORMODYNE) 100 mg tablet Take 1 Tab  by mouth every twelve (12) hours. 05/25/12  Yes Ileana Ladd, MD   prenatal vit-fe fum-fa-dss (PRENATAL 19) 29-1 mg Tab Take  by mouth.   Yes Historical Provider   ferrous sulfate (IRON) 325 mg (65 mg iron) tablet Take  by mouth Daily (before breakfast).   Yes Historical Provider        Review of Systems: A comprehensive review of systems was negative except for that written in the HPI.    Objective:     Vitals:  Filed Vitals:    07/17/12 0602 07/17/12 0617 07/17/12 0632 07/17/12 0647   BP: 147/83 145/83 146/81 157/88   Pulse: 81 78 75 76   Height:       Weight:            Physical Exam:  Patient without distress.  Heart: Regular rate and rhythm or S1S2 present  Lung: clear to auscultation throughout lung fields, no wheezes, no rales, no rhonchi and normal respiratory effort  Abdomen: soft, nontender  Fundus: soft and non tender  Lower Extremities:  - Edema 0-trace   - No evidence of DVT seen on physical exam.  Negative Homan's sign.  No cords or calf tenderness.  Membranes:  Intact  Fetal Heart Rate: Reactive  Baseline: 150s per minute  EFW: 2109gm on [place] 06/16/2012    Prenatal Labs:   Lab  Results   Component Value Date/Time    ABO,Rh O postive 03/12/2012    Antibody screen Negative 03/12/2012    Rubella equiv. 03/12/2012    HBsAg Negative 03/12/2012    HIV Negative 03/12/2012    Gonorrhea Negative 03/12/2012    Chlamydia Negative 03/12/2012        Assessment/Plan:     Plan: Admit for repeat Ceasarean section.  Group B Strep was tested, but results are pending.  R/B reviewed, questions answered, informed consent obtained.    Signed By:  Ileana Ladd, MD     July 17, 2012

## 2012-07-18 LAB — TYPE AND SCREEN
ABO/Rh: O POS
Antibody Screen: NEGATIVE

## 2012-07-18 LAB — CBC WITH AUTOMATED DIFF
ABS. BASOPHILS: 0 10*3/uL (ref 0.0–0.1)
ABS. EOSINOPHILS: 0.6 10*3/uL — ABNORMAL HIGH (ref 0.0–0.4)
ABS. LYMPHOCYTES: 2.3 10*3/uL (ref 0.8–3.5)
ABS. MONOCYTES: 0.8 10*3/uL (ref 0.0–1.0)
ABS. NEUTROPHILS: 9.9 10*3/uL — ABNORMAL HIGH (ref 1.8–8.0)
BASOPHILS: 0 % (ref 0–1)
EOSINOPHILS: 4 % (ref 0–7)
HCT: 36.1 % (ref 35.0–47.0)
HGB: 12.3 g/dL (ref 11.5–16.0)
LYMPHOCYTES: 17 % (ref 12–49)
MCH: 27.3 PG (ref 26.0–34.0)
MCHC: 34.1 g/dL (ref 30.0–36.5)
MCV: 80 FL (ref 80.0–99.0)
MONOCYTES: 6 % (ref 5–13)
NEUTROPHILS: 73 % (ref 32–75)
PLATELET: 142 10*3/uL — ABNORMAL LOW (ref 150–400)
RBC: 4.51 M/uL (ref 3.80–5.20)
RDW: 17 % — ABNORMAL HIGH (ref 11.5–14.5)
WBC: 13.6 10*3/uL — ABNORMAL HIGH (ref 3.6–11.0)

## 2012-07-18 LAB — TYPE & SCREEN
ABO/Rh(D): O POS
Antibody screen: NEGATIVE

## 2012-07-18 MED ADMIN — ibuprofen (MOTRIN) tablet 800 mg: ORAL | @ 21:00:00 | NDC 00904518740

## 2012-07-18 MED ADMIN — ibuprofen (MOTRIN) tablet 800 mg: ORAL | @ 12:00:00 | NDC 00904518740

## 2012-07-18 MED ADMIN — oxyCODONE-acetaminophen (PERCOCET) 5-325 mg per tablet 2 Tab: ORAL | @ 17:00:00 | NDC 68084035511

## 2012-07-18 MED ADMIN — oxyCODONE-acetaminophen (PERCOCET) 5-325 mg per tablet 1 Tab: ORAL | @ 08:00:00 | NDC 68084035511

## 2012-07-18 MED ADMIN — ketorolac (TORADOL) injection 30 mg: INTRAVENOUS | @ 04:00:00 | NDC 64679075801

## 2012-07-18 MED ADMIN — oxyCODONE-acetaminophen (PERCOCET) 5-325 mg per tablet 2 Tab: ORAL | @ 23:00:00 | NDC 68084035511

## 2012-07-18 MED ADMIN — oxyCODONE-acetaminophen (PERCOCET) 5-325 mg per tablet 1 Tab: ORAL | @ 02:00:00 | NDC 68084035511

## 2012-07-18 MED ADMIN — docusate sodium (COLACE) capsule 100 mg: ORAL | @ 17:00:00 | NDC 00904788980

## 2012-07-18 MED ADMIN — labetalol (NORMODYNE) tablet 100 mg: ORAL | @ 23:00:00 | NDC 00172436460

## 2012-07-18 MED ADMIN — labetalol (NORMODYNE) tablet 100 mg: ORAL | @ 12:00:00 | NDC 00172436460

## 2012-07-18 MED ADMIN — sodium chloride (NS) flush 5-10 mL: INTRAVENOUS | @ 04:00:00 | NDC 87701099893

## 2012-07-18 MED ADMIN — famotidine (PEPCID) tablet 20 mg: ORAL | @ 23:00:00 | NDC 00172572880

## 2012-07-18 MED ADMIN — oxyCODONE-acetaminophen (PERCOCET) 5-325 mg per tablet 2 Tab: ORAL | @ 12:00:00 | NDC 68084035511

## 2012-07-18 MED ADMIN — famotidine (PEPCID) tablet 20 mg: ORAL | @ 12:00:00 | NDC 00172572880

## 2012-07-18 MED FILL — IBUPROFEN 800 MG TAB: 800 mg | ORAL | Qty: 1

## 2012-07-18 MED FILL — FAMOTIDINE 20 MG TAB: 20 mg | ORAL | Qty: 1

## 2012-07-18 MED FILL — OXYCODONE-ACETAMINOPHEN 5 MG-325 MG TAB: 5-325 mg | ORAL | Qty: 2

## 2012-07-18 MED FILL — OXYCODONE-ACETAMINOPHEN 5 MG-325 MG TAB: 5-325 mg | ORAL | Qty: 1

## 2012-07-18 MED FILL — LABETALOL 100 MG TAB: 100 mg | ORAL | Qty: 1

## 2012-07-18 MED FILL — KETOROLAC TROMETHAMINE 30 MG/ML INJECTION: 30 mg/mL (1 mL) | INTRAMUSCULAR | Qty: 1

## 2012-07-18 MED FILL — DOK 100 MG CAPSULE: 100 mg | ORAL | Qty: 1

## 2012-07-18 NOTE — Progress Notes (Signed)
SBAR OUT Report: Mother    Verbal report given to d. connor rn (full name & credentials) on this patient, who is now being transferred to miu (unit) for routine progression of care.     Report consisted of patient's Situation, Background, Assessment and Recommendations (SBAR).       Newborn ID bands were compared with the identification form, and verified with the patient and receiving nurse.    Information from the SBAR, Intake/Output, MAR, Accordion and Recent Results and the [facility] Report was reviewed with the receiving nurse; opportunity for questions and clarification provided.

## 2012-07-18 NOTE — Progress Notes (Signed)
Problem: Lactation Care Plan  Goal: *Infant latching appropriately  Outcome: Progressing Towards Goal  Pt will successfully establish breastfeeding by feeding in response to early feeding cues   or wake every 3h, will obtain deep latch, and will keep log of feedings/output.    Breast Assessment  Left Breast: Large  Left Nipple: Everted;Intact  Right Breast: Large  Right Nipple: Everted;Intact  Breast- Feeding Assessment  Attends Breast-Feeding Classes: No (BF two of her previous children)  Breast-Feeding Experience: Yes  Breast Trauma/Surgery: No  Type/Quality: Good  Lactation Consultant Visits  Breast-Feedings: Good   Mother/Infant Observation  Mother Observation: Alignment;Breast comfortable;Close hold  Infant Observation: Lips flanged, lower;Lips flanged, upper;Opens mouth  LATCH Documentation  Latch: Grasps breast, tongue down, lips flanged, rhythmic sucking  Audible Swallowing: A few with stimulation  Type of Nipple: Everted (after stimulation)  Comfort (Breast/Nipple): Soft/non-tender  Hold (Positioning): Full assist, teach one side, mother does other, staff holds  [facility] Score: 8    Nipple shield recommended due to preterm.  Pros and cons of nipple shield use reviewed.  Patient instructed how to apply shield to nipple/areola and cleaning of nipple shield.  Nipple shield plan of care includes breastfeeding with nipple shield per instructions, complement/supplement formula/pumped breast milk by bottle technique and pump every 3 hours.  Reinforces with pt that nipple shield is best used as temporary tool/aid to help infant learn how to latch onto breast.  Recommend follow-up with OP lactation consultant after discharge from hospital.  Reviewed community resources for breastfeeding support.   Guidelines for pumping, milk collection and storage, proper cleaning of pump parts all reviewed.  Differences between hospital grade rental pumps vs store bought double electric/hand pumps discussed.  Set up pumping with  double electric set up.  Assisted with pump session.  List of area pump rental locations and lactation support services reviewed.

## 2012-07-18 NOTE — Progress Notes (Signed)
Bedside and Verbal shift change report given to Lisa Townsend (oncoming nurse) by Cat Palmiter (offgoing nurse).  Report given with SBAR, Kardex, Procedure Summary, Intake/Output, MAR, Accordion and Recent Results.

## 2012-07-18 NOTE — Progress Notes (Signed)
Post-Operative C-Section        S/ Taking PO , no complaints    O/  BP 139-160/67-81 AF           Abdomen with expected tenderness         Fundus firm         Dressing dry           Hgb:12.0    A/  POD 1 LTCS, stable    P/  D/C foley and dressing, ambulate, advance diet

## 2012-07-19 MED ORDER — LABETALOL 100 MG TAB
100 mg | ORAL_TABLET | Freq: Two times a day (BID) | ORAL | Status: DC
Start: 2012-07-19 — End: 2013-03-17

## 2012-07-19 MED ORDER — OXYCODONE-ACETAMINOPHEN 5 MG-325 MG TAB
5-325 mg | ORAL_TABLET | ORAL | Status: DC | PRN
Start: 2012-07-19 — End: 2013-03-17

## 2012-07-19 MED ADMIN — oxyCODONE-acetaminophen (PERCOCET) 5-325 mg per tablet 2 Tab: ORAL | @ 22:00:00 | NDC 68084035511

## 2012-07-19 MED ADMIN — ibuprofen (MOTRIN) tablet 800 mg: ORAL | @ 14:00:00 | NDC 00904518740

## 2012-07-19 MED ADMIN — labetalol (NORMODYNE) tablet 200 mg: ORAL | @ 22:00:00 | NDC 00185011701

## 2012-07-19 MED ADMIN — oxyCODONE-acetaminophen (PERCOCET) 5-325 mg per tablet 2 Tab: ORAL | @ 03:00:00 | NDC 68084035511

## 2012-07-19 MED ADMIN — oxyCODONE-acetaminophen (PERCOCET) 5-325 mg per tablet 2 Tab: ORAL | @ 15:00:00 | NDC 68084035511

## 2012-07-19 MED ADMIN — oxyCODONE-acetaminophen (PERCOCET) 5-325 mg per tablet 2 Tab: ORAL | @ 18:00:00 | NDC 68084035511

## 2012-07-19 MED ADMIN — docusate sodium (COLACE) capsule 100 mg: ORAL | @ 14:00:00 | NDC 00904788980

## 2012-07-19 MED ADMIN — ibuprofen (MOTRIN) tablet 800 mg: ORAL | @ 04:00:00 | NDC 00904518740

## 2012-07-19 MED ADMIN — oxyCODONE-acetaminophen (PERCOCET) 5-325 mg per tablet 2 Tab: ORAL | @ 07:00:00 | NDC 68084035511

## 2012-07-19 MED ADMIN — ibuprofen (MOTRIN) tablet 800 mg: ORAL | @ 22:00:00 | NDC 00904518740

## 2012-07-19 MED ADMIN — oxyCODONE-acetaminophen (PERCOCET) 5-325 mg per tablet 1 Tab: ORAL | NDC 68084035511

## 2012-07-19 MED ADMIN — labetalol (NORMODYNE) tablet 100 mg: ORAL | @ 14:00:00 | NDC 00172436460

## 2012-07-19 MED ADMIN — diphenhydrAMINE (BENADRYL) capsule 25 mg: ORAL | @ 03:00:00 | NDC 00904530660

## 2012-07-19 MED ADMIN — oxyCODONE-acetaminophen (PERCOCET) 5-325 mg per tablet 2 Tab: ORAL | @ 11:00:00 | NDC 68084035511

## 2012-07-19 MED ADMIN — famotidine (PEPCID) tablet 20 mg: ORAL | @ 14:00:00 | NDC 00172572880

## 2012-07-19 MED ADMIN — famotidine (PEPCID) tablet 20 mg: ORAL | @ 22:00:00 | NDC 00172572880

## 2012-07-19 MED FILL — LABETALOL 100 MG TAB: 100 mg | ORAL | Qty: 1

## 2012-07-19 MED FILL — IBUPROFEN 800 MG TAB: 800 mg | ORAL | Qty: 1

## 2012-07-19 MED FILL — OXYCODONE-ACETAMINOPHEN 5 MG-325 MG TAB: 5-325 mg | ORAL | Qty: 2

## 2012-07-19 MED FILL — FAMOTIDINE 20 MG TAB: 20 mg | ORAL | Qty: 1

## 2012-07-19 MED FILL — DOK 100 MG CAPSULE: 100 mg | ORAL | Qty: 1

## 2012-07-19 MED FILL — LABETALOL 200 MG TAB: 200 mg | ORAL | Qty: 1

## 2012-07-19 MED FILL — DIPHENHYDRAMINE 25 MG CAP: 25 mg | ORAL | Qty: 1

## 2012-07-19 MED FILL — OXYCODONE-ACETAMINOPHEN 5 MG-325 MG TAB: 5-325 mg | ORAL | Qty: 1

## 2012-07-19 NOTE — Progress Notes (Signed)
Post-Operative Day Number 2 Progress Note    Patient doing well post-op day 2 from cesarean delivery without significant complaints.  Pain controlled on current medication.  Voiding without difficulty, normal lochia.  Tolerating regular diet without nausea or vomiting.  +flatus.  Some cramping with nursing.  Taking Labetalol 100mg  BID    Vitals:  Patient Vitals for the past 8 hrs:   BP Temp Pulse Resp   07/19/12 0922 154/86 mmHg 97.8 ??F (36.6 ??C) 76  20      Temp (24hrs), Avg:97.9 ??F (36.6 ??C), Min:97.8 ??F (36.6 ??C), Max:98.2 ??F (36.8 ??C)      Vital signs stable, afebrile.    Exam:  Patient without distress               Lungs:  CTA bilaterally               CV:  Regular rate and rhythm               Abdomen soft, nondistended, normal bowel sounds               Uterus: fundus firm at level of umbilicus, nontender.                 Incision: no erythema, exudate or induration                Lower extremities are negative for cords or tenderness; 1+ edema.    Labs: No results found for this or any previous visit (from the past 24 hour(s)).    Assessment and Plan:  Postoperative day #2,  BPs still somewhat elevated on Labetalol 100mg  BID, otherwise uncomplicated post-cesarean course.       - increase Labetalol to 200mg  BID  - routine postop care  - anticipate discharge in AM

## 2012-07-19 NOTE — Progress Notes (Signed)
Bedside and Verbal shift change report given to Callie Robbins RNC (oncoming nurse) by Ashley Johnson RN (offgoing nurse).  Report given with SBAR, Kardex, Intake/Output and MAR.

## 2012-07-19 NOTE — Progress Notes (Signed)
Bedside and Verbal shift change report given to A. Johnson, RN (oncoming nurse) by C. Robbins, RNC (offgoing nurse).  Report given with SBAR, Kardex, Intake/Output, MAR and Recent Results.

## 2012-07-20 MED ADMIN — oxyCODONE-acetaminophen (PERCOCET) 5-325 mg per tablet 2 Tab: ORAL | @ 09:00:00 | NDC 68084035511

## 2012-07-20 MED ADMIN — ibuprofen (MOTRIN) tablet 800 mg: ORAL | @ 09:00:00 | NDC 00904518740

## 2012-07-20 MED ADMIN — oxyCODONE-acetaminophen (PERCOCET) 5-325 mg per tablet 2 Tab: ORAL | @ 13:00:00 | NDC 68084035511

## 2012-07-20 MED ADMIN — famotidine (PEPCID) tablet 20 mg: ORAL | @ 13:00:00 | NDC 00172572880

## 2012-07-20 MED ADMIN — labetalol (NORMODYNE) tablet 200 mg: ORAL | @ 13:00:00 | NDC 00185011701

## 2012-07-20 MED ADMIN — docusate sodium (COLACE) capsule 100 mg: ORAL | @ 13:00:00 | NDC 00904788980

## 2012-07-20 MED ADMIN — oxyCODONE-acetaminophen (PERCOCET) 5-325 mg per tablet 2 Tab: ORAL | @ 04:00:00 | NDC 68084035511

## 2012-07-20 MED FILL — OXYCODONE-ACETAMINOPHEN 5 MG-325 MG TAB: 5-325 mg | ORAL | Qty: 2

## 2012-07-20 MED FILL — LABETALOL 200 MG TAB: 200 mg | ORAL | Qty: 1

## 2012-07-20 MED FILL — FAMOTIDINE 20 MG TAB: 20 mg | ORAL | Qty: 1

## 2012-07-20 MED FILL — DOK 100 MG CAPSULE: 100 mg | ORAL | Qty: 1

## 2012-07-20 MED FILL — IBUPROFEN 800 MG TAB: 800 mg | ORAL | Qty: 1

## 2012-07-20 NOTE — Progress Notes (Signed)
Bedside and Verbal shift change report given to A. Johnson, RN (oncoming nurse) by C. Robbins, RNC (offgoing nurse).  Report given with SBAR, Kardex, Intake/Output, MAR and Recent Results.

## 2012-07-20 NOTE — Progress Notes (Signed)
Discharge instructions provided at bedside. Questions asked and answered. Extra supplies provided. Prescricptions given. Discharged to home in wheelchair.

## 2012-07-20 NOTE — Lactation Note (Signed)
Pt chooses to do both breast and bottle.  Discussed effects of early supplementation on breastfeeding success; may decrease breastmilk production and supply, increase risk for pathological engorgement, baby may develop preference for faster flow from bottles vs breast, and baby's stomach can be stretched if larger volumes of formula are given.    Nipple shield recommended due to baby's gestational age.  Pros and cons of nipple shield use reviewed.  Patient instructed how to apply shield to nipple/areola and cleaning of nipple shield.  Nipple shield plan of care includes breastfeeding with nipple shield per instructions, complement/supplement  formula/pumped breast milk per Pediatricians recommendation.  Discussed pumping.  Shown how to use pump as a hand pump.  Mom called WIC and has appt. for tomorrow to get pump.  Reinforces with pt that nipple shield is best used as temporary tool/aid to help infant learn how to latch onto breast.  Recommend follow-up with OP lactation consultant after discharge from hospital.  Reviewed community resources for breastfeeding support.  Mom has WIC number.    Chart shows numerous feedings, void, stool WNL.  Discussed Importance of monitoring outputs and feedings on first week of  Breastfeeding.  Discussed ways to tell if baby getting enough, ie  Voids and stools, change in color of stool, and return to birth wt within 2 weeks.  Follow up with pediatrician visit for weight check in 1-2 days reviewed. Encouraged to call warm line number, [phone] or [facility] at [phone]  for any questions/problems that arise.     Pt will successfully establish breastfeeding by feeding in response to early feeding cues   or wake every 3h, will obtain deep latch, and will keep log of feedings/output.    Breast Assessment  Left Breast: Large  Left Nipple: Everted;Intact  Right Breast: Large  Right Nipple: Everted;Intact  Breast- Feeding Assessment  Attends Breast-Feeding Classes: No (BF two of  her previous children)  Breast-Feeding Experience: Yes  Breast Trauma/Surgery: No  Type/Quality: Good  Lactation Consultant Visits  Breast-Feedings: Good   Mother/Infant Observation  Mother Observation: Alignment;Breast comfortable;Close hold  Infant Observation: Lips flanged, lower;Lips flanged, upper;Opens mouth

## 2012-07-20 NOTE — Progress Notes (Signed)
Post-Operative Day Number 3 Progress/Discharge Note    Patient doing well post-op day 3 from cesarean delivery without significant complaints.  Pain controlled on current medication.  Voiding without difficulty, normal lochia.  Tolerating regular diet without nausea or vomiting.  +flatus.  Labetalol increased to 200mg  BID yesterday    Vitals:  No data found.    Temp (24hrs), Avg:98 ??F (36.7 ??C), Min:97.8 ??F (36.6 ??C), Max:98.2 ??F (36.8 ??C)      Vital signs stable, afebrile.    Exam:  Patient without distress.               Lungs:  CTA bilaterally               CV:  Regular rate and rhythm               Abdomen soft, nondistended, normal bowel sounds               Uterus: fundus firm at level of umbilicus, nontender.                 Incision:  Intact,  without erythema, exudate, or induration.               Lower extremities are negative for cords or tenderness; 1+ edema.    Labs: No results found for this or any previous visit (from the past 24 hour(s)).    Assessment and Plan:  Postoperative day #3, S/P C/S.  Doing well.   - discharge to home   - Percocet 5/325 #30   - Labetalol 200mg  BID.  RTO 1wk for BP check.  Continue BP checks at home.  Call office if SBPs consistently 160s-170s or higher.

## 2012-07-25 NOTE — Discharge Summary (Signed)
Obstetrical Discharge Summary     Name: Julie Daniels MRN: [id number]  SSN: [id number]    Date of Birth: 08-11-1966  Age: 46 y.o.  Sex: female      Admit Date: 07/17/2012    Discharge Date: 07/25/2012     Admitting Physician: Ileana Ladd, MD     Attending Physician:  No att. providers found     Admission Diagnoses: Repeat C/S  Repeat C/S    Discharge Diagnoses:   Information for the patient's newborn:  [name], [name] [[id number]]   Delivery of a 2.94 kg female infant via Low Transverse C-Section  on 07/17/2012 at 8:00 AM  by Ileana Ladd. Apgars were 8 and 9.       Additional Diagnoses:    Lab Results   Component Value Date/Time    ABO,Rh O postive 03/12/2012    Antibody screen Negative 03/12/2012    Rubella equiv. 03/12/2012    GrBS Unknown 07/17/2012       Discharge Condition: good    Hospital Course: She was restarted on her labetalol 100mg  BID postpartum.  Her dose was increased to 200mg  BID.  BPs 150s/70s at time of discharge.  Her postoperative course was otherwise uncomplicated.  She will monitor her BPs at home, call if SBPs consistently 160s-170s or higher, or DBP 100s or higher.  She will f/u in the office in 1wk for a BP check.      Patient Instructions:   Cannot display discharge medications since this patient is not currently admitted.      Reference my discharge instructions.    Follow-up Appointments   Procedures   ??? FOLLOW UP VISIT Appointment in: One Week BP check     Standing Status: Standing      Number of Occurrences: 1      Standing Expiration Date:      BP check     Order Specific Question:  Appointment in     Answer:  One Week        Signed By:  Ileana Ladd, MD     July 25, 2012

## 2013-02-08 NOTE — Telephone Encounter (Signed)
Pt would like a refill for Prenatal Vitmains - she is still breast feeding.     According to the Rx fax from [facility] ([phone] & F: [phone]), an alternative Rx will need to be sent. They request strength, directions, quantity and refills.

## 2013-02-08 NOTE — Telephone Encounter (Signed)
Pt notified - pt will call back w/covered med

## 2013-02-08 NOTE — Telephone Encounter (Signed)
Have her check with her pharmacy to see what her insurance will cover.  I will be happy to send RX.

## 2013-03-17 NOTE — Progress Notes (Signed)
Annual exam ages 69-64    Julie Daniels is a G6 P56,  47 y.o. female WHITE OR CAUCASIAN whose No LMP was last week. Patient is having regular monthly periods, but lighter.  Has Mirena, placed 08/2012.  was on  who presents for her annual checkup.     Still nursing, starting to taper off.  Milk supply never came back fully after taking HCTZ.  Currently on Metoprolol.      She is having no significant problems.    With regard to the Gardasil vaccine, she is older than the age for which it is FDA approved.    Menstrual status:    Her periods are absent in flow. She is using essentially none pads or tampons per day, minimal to none using Mirena.    She denies dysmenorrhea.    She denies premenstrual symptoms.    Contraception:    The current method of family planning is IUD.    Sexual history:     reports that she currently engages in sexual activity. She reports using the following method of birth control/protection: IUD. 09/11/12    Medical conditions:    Since her last annual GYN exam about one year ago,  she has the following changes in her health history: none.     Pap and Mammogram History:    Her most recent Pap smear was normal, HPV was negative, but no endocervical cells,  obtained 1 year(s) ago.  03/12/2012.    The patient has never had a mammogram. Patient is still breast feeding, not recommended to do at this point. Need to wait at least 6 months after breast feeding.    Osteoporosis History:    Family history does not include a first or second degree relative with osteopenia or osteoporosis.    Past Medical History   Diagnosis Date   ??? Essential hypertension    ??? Diabetes mellitus      gestational DM   ??? Herpes gestationis    ??? Screening for malignant neoplasm of the cervix 03/12/12     Negative ; HPV Negative   ??? Encounter for insertion of mirena IUD Inserted 09/11/12     Past Surgical History   Procedure Laterality Date   ??? Pr cesarean delivery only  08/27/98 and 07/17/12         ??? Hx other surgical        ectopic pregnancy removal     OB History    Grav Para Term Preterm Abortions TAB SAB Ect Mult Living    6 5 2 3 1   1  3        # Outc Date GA Lbr Len/2nd Wgt Sex Del Anes PTL Lv    1 TRM 3/90    F VAGINAL DELI None No Yes    2 TRM 11/91    F VAGINAL DELI None No Yes    3 ECT 1997            4 PRE 9/99    M VAGINAL DELI None Yes SB    Comments: twin A delivered vaginally and then twin B delivered via c-section    5 PRE 9/99    M C-SECTION CL SPINAL AN Yes Yes    6 PRE 8/13 [redacted]w[redacted]d 00:00  F C-SECTION LO             Current Outpatient Prescriptions   Medication Sig Dispense Refill   ??? metoprolol (LOPRESSOR) 25 mg tablet Take  by mouth  two (2) times a day.       ??? levonorgestrel (MIRENA) 20 mcg/24 hour (5 years) IUD 1 Each by IntraUTERine route once.       ??? nystatin (MYCOSTATIN) topical cream Apply  to affected area two (2) times a day.  30 g  3   ??? diphenhydrAMINE (BENADRYL) 25 mg capsule Take 25 mg by mouth every six (6) hours as needed.       ??? prenatal vit-fe fum-fa-dss (PRENATAL 19) 29-1 mg Tab Take  by mouth.       ??? ferrous sulfate (IRON) 325 mg (65 mg iron) tablet Take  by mouth Daily (before breakfast).         Allergies: Tape   History     Social History   ??? Marital Status: MARRIED     Spouse Name: N/A     Number of Children: N/A   ??? Years of Education: N/A     Occupational History   ??? Not on file.     Social History Main Topics   ??? Smoking status: Former Smoker     Quit date: 09/16/2003   ??? Smokeless tobacco: Never Used   ??? Alcohol Use: No   ??? Drug Use: No   ??? Sexually Active: Yes -- Female partner(s)     Birth Control/ Protection: IUD     Other Topics Concern   ??? Not on file     Social History Narrative   ??? No narrative on file     Tobacco History:  reports that she quit smoking about 9 years ago. She has never used smokeless tobacco.  Alcohol Abuse:  reports that she does not drink alcohol.  Drug Abuse:  reports that she does not use illicit drugs.    There is no problem list on file for this patient.     Family History   Problem Relation Age of Onset   ??? Cancer Mother    ??? Heart Disease Mother    ??? Heart Disease Father    ??? Hypertension Father    ??? Hypertension Brother        Review of Systems - History obtained from the patient  Constitutional: negative for weight loss, fever, night sweats  HEENT: negative for hearing loss, earache, congestion, snoring, sorethroat  CV: negative for chest pain, palpitations, edema  Resp: negative for cough, shortness of breath, wheezing  GI: negative for change in bowel habits, abdominal pain, black or bloody stools  GU: negative for frequency, dysuria, hematuria, vaginal discharge  MSK: negative for back pain, joint pain, muscle pain  Breast: negative for breast lumps, nipple discharge, galactorrhea  Skin :negative for itching, rash, hives  Neuro: negative for dizziness, headache, confusion, weakness  Psych: negative for anxiety, depression, change in mood  Heme/lymph: negative for bleeding, bruising, pallor    Physical Exam    BP 140/82   Ht 5\' 6"  (1.676 m)   Wt 274 lb (124.286 kg)   BMI 44.25 kg/m2   Breastfeeding? Yes    Constitutional  ?? Appearance: well-nourished, well developed, alert, in no acute distress    HENT  ?? Head and Face: appears normal    Neck  ?? Inspection/Palpation: normal appearance, no masses or tenderness  ?? Lymph Nodes: no lymphadenopathy present  ?? Thyroid: gland size normal, nontender, no nodules or masses present on palpation    Chest  ?? Respiratory Effort: breathing unlabored  ?? Auscultation: normal breath sounds    Cardiovascular  ?? Heart:  ??  Auscultation: regular rate and rhythm without murmur    Breasts  ?? Inspection of Breasts: breasts symmetrical, no skin changes, no discharge present, nipple appearance normal, no skin retraction present  ?? Palpation of Breasts and Axillae: no masses present on palpation, no breast tenderness  ?? Axillary Lymph Nodes: no lymphadenopathy present    Gastrointestinal  ?? Abdominal Examination: abdomen non-tender to  palpation, normal bowel sounds, no masses present  ?? Liver and spleen: no hepatomegaly present, spleen not palpable  ?? Hernias: no hernias identified    Skin  ?? General Inspection: tinea rash on lower abdomen under pannus; no lesions identified    Neurologic/Psychiatric  ?? Mental Status:  ?? Orientation: grossly oriented to person, place and time  ?? Mood and Affect: mood normal, affect appropriate    Genitourinary  ?? External Genitalia: normal appearance for age, no discharge present, no tenderness present, no inflammatory lesions present, no masses present, no atrophy present  ?? Vagina: normal vaginal vault without central or paravaginal defects, no discharge present, no inflammatory lesions present, no masses present  ?? Bladder: non-tender to palpation  ?? Urethra: appears normal  ?? Cervix: normal, very anterior.  IUD strings seen, ~1.5-2cm  ?? Uterus: normal size, shape and consistency  ?? Adnexa: no adnexal tenderness present, no adnexal masses present  ?? Perineum: perineum within normal limits, no evidence of trauma, no rashes or skin lesions present  ?? Anus: anus within normal limits, no hemorrhoids present  ?? Inguinal Lymph Nodes: no lymphadenopathy present      Assessment & Plan:  ?? Routine gynecologic examination.  Had neg pap/HPV, but no endocervical cells 02/2012 -> will be due 2016.  ?? Her current medical status is satisfactory with no evidence of significant gynecologic issues.  ?? +tinea -> Mycostin RX (Nystatin powder not covered).  ?? Counseled re: diet, exercise, healthy lifestyle  ?? Return for yearly wellness visits  ?? Rec annual mammogram - should plan to do ~67months after she stops nursing (if this is close to AWWE next year, OK to wait until then).           Orders Placed This Encounter   ??? MAM MAMMO BI SCREENING DIGTL     Standing Status: Future      Number of Occurrences: 1      Standing Expiration Date: 04/16/2014     Order Specific Question:  Reason for Exam     Answer:  screening     Order Specific  Question:  Is Patient Pregnant?     Answer:  No   ??? nystatin (MYCOSTATIN) topical cream     Sig: Apply  to affected area two (2) times a day.     Dispense:  30 g     Refill:  3

## 2013-08-06 NOTE — Telephone Encounter (Signed)
Pt left message on med refill line  States her insurance has changed and wants to see if nystatin powder can be called in instead of using the nystatin cream  Advise

## 2013-08-09 NOTE — Telephone Encounter (Signed)
Pt notified of MD response with verbal understanding  Verbal order ok for nystatin powder; apply 2-3 times daily to affected area for 1-2 weeks #15 g with 1 refill

## 2013-08-09 NOTE — Telephone Encounter (Signed)
Yes, OK to switch to nystatin powder, apply 2-3x/d for 1-2 wks.  She can also f/u with her PCP for this.

## 2013-08-10 NOTE — Telephone Encounter (Signed)
Patient insurance has not change.  [employer] has paid within last six months  for Nystatin topical cream, it is on their   "formulary"; however the powder is not.  I spoke to [place] with ins co. and I got  started the prior authorization process, since  patient would prefer the powder. I spoke to the  patient; she understands and agreed, that if the powder  is not approve, she will continue to use the topical cream.

## 2013-08-10 NOTE — Telephone Encounter (Signed)
Insurance company approved request,  Pharmacy was informed.

## 2014-06-03 MED ORDER — NYSTATIN 100,000 UNIT/G TOPICAL POWDER
100000 unit/gram | CUTANEOUS | Status: AC
Start: 2014-06-03 — End: ?

## 2014-06-03 NOTE — Addendum Note (Signed)
Addended by: Harrington ChallengerWELLARD, JESSICA A on: 06/03/2014 10:12 AM     Modules accepted: Level of Service

## 2014-06-03 NOTE — Patient Instructions (Signed)
Well Visit, Ages 18 to 50: After Your Visit  Your Care Instructions  Physical exams can help you stay healthy. Your doctor has checked your overall health and may have suggested ways to take good care of yourself. He or she also may have recommended tests. At home, you can help prevent illness with healthy eating, regular exercise, and other steps.  Follow-up care is a key part of your treatment and safety. Be sure to make and go to all appointments, and call your doctor if you are having problems. It's also a good idea to know your test results and keep a list of the medicines you take.  How can you care for yourself at home?  ?? Reach and stay at a healthy weight. This will lower your risk for many problems, such as obesity, diabetes, heart disease, and high blood pressure.  ?? Get at least 30 minutes of physical activity on most days of the week. Walking is a good choice. You also may want to do other activities, such as running, swimming, cycling, or playing tennis or team sports. Discuss any changes in your exercise program with your doctor.  ?? Do not smoke or allow others to smoke around you. If you need help quitting, talk to your doctor about stop-smoking programs and medicines. These can increase your chances of quitting for good.  ?? Talk to your doctor about whether you have any risk factors for sexually transmitted infections (STIs). Having one sex partner (who does not have STIs and does not have sex with anyone else) is a good way to avoid these infections.  ?? Use birth control if you do not want to have children at this time. Talk with your doctor about the choices available and what might be best for you.  ?? Always wear sunscreen on exposed skin. Make sure the sunscreen blocks ultraviolet rays (both UVA and UVB) and has a sun protection factor (SPF) of at least 15. Use it every day, even when it is cloudy. Some doctors may recommend a higher SPF, such as 30.  ?? See a dentist one or two times a year  for checkups and to have your teeth cleaned.  ?? Wear a seat belt in the car.  ?? Drink alcohol in moderation, if at all. That means no more than 2 drinks a day for men and 1 drink a day for women.  Follow your doctor's advice about when to have certain tests. These tests can spot problems early.  For everyone  ?? Cholesterol. Have the fat (cholesterol) in your blood tested after age 20. Your doctor will tell you how often to have this done based on your age, family history, or other things that can increase your risk for heart disease.  ?? Blood pressure. Experts suggest that healthy adults with normal blood pressure (119/79 mm Hg or below) have their blood pressure checked at least every 1 to 2 years. This can be done during a routine doctor visit. If you have slightly higher or high blood pressure, your doctor will suggest more frequent tests.  ?? Vision. Talk with your doctor about how often to have a glaucoma test.  ?? Diabetes. Ask your doctor whether you should have tests for diabetes.  ?? Colon cancer. Have a test for colon cancer at age 50. You may have one of several tests. If you are younger than 50, you may need a test earlier if you have any risk factors. Risk factors include whether you already   had a precancerous polyp removed from your colon or whether your parent, brother, sister, or child has had colon cancer.  For women  ?? Breast exam and mammogram. Talk to your doctor about when you should have a clinical breast exam and a mammogram. Medical experts differ on whether and how often women under 50 should have these tests. Your doctor can help you decide what is right for you.  ?? Pap test and pelvic exam. Begin Pap tests at age 21. A Pap test is the best way to find cervical cancer. The test often is part of a pelvic exam. Ask how often to have this test.  ?? Tests for sexually transmitted infections (STIs). Ask whether you should have tests for STIs. You may be at risk if you have sex with more than one  person, especially if your partners do not wear condoms.  For men  ?? Tests for sexually transmitted infections (STIs). Ask whether you should have tests for STIs. You may be at risk if you have sex with more than one person, especially if you do not wear a condom.  ?? Testicular cancer exam. Ask your doctor whether you should check your testicles regularly.  ?? Prostate exam. Talk to your doctor about whether you should have a blood test (called a PSA test) for prostate cancer. Experts differ on whether and when men should have this test. Some experts suggest it if you are older than 45 and are African-American or have a father or brother who got prostate cancer when he was younger than 65.  When should you call for help?  Watch closely for changes in your health, and be sure to contact your doctor if you have any problems or symptoms that concern you.   Where can you learn more?   Go to http://www.healthwise.net/[facility]  Enter P072 in the search box to learn more about "Well Visit, Ages 18 to 50: After Your Visit."   ?? 2006-2015 Healthwise[employer] Inc[employer]. Care instructions adapted under license by [facility] (which disclaims liability or warranty for this information). This care instruction is for use with your licensed healthcare professional. If you have questions about a medical condition or this instruction, always ask your healthcare professional. Healthwise, Incorporated disclaims any warranty or liability for your use of this information.  Content Version: 10.5.422740; Current as of: February 04, 2014

## 2014-06-03 NOTE — Progress Notes (Signed)
Annual exam ages 13-64    Julie Daniels is a G6 P86,  48 y.o. female WHITE OR CAUCASIAN No LMP recorded. Patient is not currently having periods (Reason: IUD)., who presents for her annual checkup.   Mirena IUD placed 08/2012.  Doing well.  Some spotting every few months.  No pain.  Does not check strings.    Needs refill on Nystatin powder - uses under pannus, more in summer months.    She is having no significant problems.    With regard to the Gardasil vaccine, she is older than the age for which it is FDA approved.    Menstrual status:    Her periods are absent in flow. She is using essentially none pads or tampons per day, minimal to none using Mirena.    She denies dysmenorrhea.    She denies premenstrual symptoms.    Contraception:    The current method of family planning is IUD.    Sexual history:     reports that she currently engages in sexual activity and has had female partners. She reports using the following method of birth control/protection: IUD.    Medical conditions:    Since her last annual GYN exam about one year ago,  she has the following changes in her health history: none.     Pap and Mammogram History:    Her most recent Pap smear was normal, HPV was negative, obtained 03/12/12.    The patient had her mammogram today in our office.    Osteoporosis History:    Family history does not include a first or second degree relative with osteopenia or osteoporosis.      Past Medical History   Diagnosis Date   ??? Essential hypertension    ??? Diabetes mellitus      gestational DM   ??? Herpes gestationis    ??? Screening for malignant neoplasm of the cervix 03/12/12     Negative ; HPV Negative   ??? Encounter for insertion of mirena IUD Inserted 09/11/12     Past Surgical History   Procedure Laterality Date   ??? Pr cesarean delivery only  08/27/98 and 07/17/12         ??? Hx other surgical       ectopic pregnancy removal     OB History   Gravida Para Term Preterm AB SAB TAB Ectopic Multiple Living   6 5 2 3 1   1  3        # Outcome Date GA Lbr Len/2nd Weight Sex Delivery Anes PTL Lv   6 Preterm 07/17/12 [redacted]w[redacted]d   F C-SECTION LO      5 Preterm 08/27/98    M C-SECTION CL SPINAL AN Y Y   4 Preterm 08/27/98    M VAGINAL DELI None Y FD      Comments: twin A delivered vaginally and then twin B delivered via c-section   3 Ectopic 1997           2 Term 11/08/90    F VAGINAL DELI None N Y   1 Term 02/28/89    F VAGINAL DELI None N Y          Current Outpatient Prescriptions   Medication Sig Dispense Refill   ??? metoprolol (LOPRESSOR) 100 mg tablet   2   ??? itraconazole (SPORONAX) 100 mg capsule      ??? econazole nitrate (SPECTAZOLE) 1 % topical cream   4   ??? multivitamin (ONE A  DAY) tablet Take 1 Tab by mouth daily.     ??? nystatin (MYCOSTATIN) powder Apply  to affected area four (4) times daily. Apply 2-3 times daily to affected area for 1-2 weeks 15 g 1   ??? levonorgestrel (MIRENA) 20 mcg/24 hour (5 years) IUD 1 Each by IntraUTERine route once.     ??? nystatin (MYCOSTATIN) topical cream Apply  to affected area two (2) times a day. 30 g 3   ??? diphenhydrAMINE (BENADRYL) 25 mg capsule Take 25 mg by mouth every six (6) hours as needed.     ??? ferrous sulfate (IRON) 325 mg (65 mg iron) tablet Take  by mouth Daily (before breakfast).     ??? metoprolol (LOPRESSOR) 25 mg tablet Take  by mouth two (2) times a day.     ??? prenatal vit-fe fum-fa-dss (PRENATAL 19) 29-1 mg Tab Take  by mouth.       Allergies: Tape   History     Social History   ??? Marital Status: MARRIED     Spouse Name: N/A     Number of Children: N/A   ??? Years of Education: N/A     Occupational History   ??? Not on file.     Social History Main Topics   ??? Smoking status: Former Smoker     Quit date: 09/16/2003   ??? Smokeless tobacco: Never Used   ??? Alcohol Use: No   ??? Drug Use: No   ??? Sexual Activity:     Partners: Male     CopyBirth Control/ Protection: IUD     Other Topics Concern   ??? Not on file     Social History Narrative     Tobacco History:  reports that she quit smoking about 10 years ago.  She has never used smokeless tobacco.  Alcohol Abuse:  reports that she does not drink alcohol.  Drug Abuse:  reports that she does not use illicit drugs.    There is no problem list on file for this patient.    Family History   Problem Relation Age of Onset   ??? Cancer Mother    ??? Heart Disease Mother    ??? Heart Disease Father    ??? Hypertension Father    ??? Hypertension Brother        Review of Systems - History obtained from the patient  Constitutional: negative for weight loss, fever, night sweats  HEENT: negative for hearing loss, earache, congestion, snoring, sorethroat  CV: negative for chest pain, palpitations, edema  Resp: negative for cough, shortness of breath, wheezing  GI: negative for change in bowel habits, abdominal pain, black or bloody stools  GU: negative for frequency, dysuria, hematuria, vaginal discharge  MSK: negative for back pain, joint pain, muscle pain  Breast: negative for breast lumps, nipple discharge, galactorrhea  Skin :negative for itching, rash, hives  Neuro: negative for dizziness, headache, confusion, weakness  Psych: negative for anxiety, depression, change in mood  Heme/lymph: negative for bleeding, bruising, pallor    Physical Exam    BP 138/70 mmHg   Ht 5\' 6"  (1.676 m)   Wt 290 lb (131.543 kg)   BMI 46.83 kg/m2    Constitutional  ?? Appearance: well-nourished, well developed, alert, in no acute distress    HENT  ?? Head and Face: appears normal    Neck  ?? Inspection/Palpation: normal appearance, no masses or tenderness  ?? Lymph Nodes: no lymphadenopathy present  ?? Thyroid: gland size normal, nontender,  no nodules or masses present on palpation    Chest  ?? Respiratory Effort: breathing unlabored  ?? Auscultation: normal breath sounds    Cardiovascular  ?? Heart:  ?? Auscultation: regular rate and rhythm without murmur    Breasts  ?? Inspection of Breasts: breasts symmetrical, no skin changes, no discharge present, nipple appearance normal, no skin retraction present  ?? Palpation of  Breasts and Axillae: no masses present on palpation, no breast tenderness  ?? Axillary Lymph Nodes: no lymphadenopathy present    Gastrointestinal  ?? Abdominal Examination: abdomen non-tender to palpation, normal bowel sounds, no masses present  ?? Liver and spleen: no hepatomegaly present, spleen not palpable  ?? Hernias: no hernias identified    Genitourinary  ?? External Genitalia: normal appearance for age, no discharge present, no tenderness present, no inflammatory lesions present, no masses present, no atrophy present  ?? Vagina: normal vaginal vault without central or paravaginal defects, no discharge present, no inflammatory lesions present, no masses present  ?? Bladder: non-tender to palpation  ?? Urethra: appears normal  ?? Cervix: normal; IUD strings not visible  ?? Uterus: [bimanual exam limited by habitus] no masses appreciated  ?? Adnexa: [bimanual limited by habitus] no adnexal tenderness present, no adnexal masses present  ?? Perineum: perineum within normal limits, no evidence of trauma, no rashes or skin lesions present  ?? Anus: anus within normal limits, no hemorrhoids present  ?? Inguinal Lymph Nodes: no lymphadenopathy present    Skin  ?? General Inspection: no rash, no lesions identified    Neurologic/Psychiatric  ?? Mental Status:  ?? Orientation: grossly oriented to person, place and time  ?? Mood and Affect: mood normal, affect appropriate        Assessment & Plan:  ?? Routine gynecologic examination.  Neg pap/HPV 02/2012.  ?? Her current medical status is satisfactory with no evidence of significant gynecologic issues.  ?? Counseled re: diet, exercise, healthy lifestyle  ?? Return for yearly wellness visits  ?? Rec annual mammogram.  Done today in our office.  ?? IUD strings not visible.  Will schedule pelvic US to confirm position.  ?? Refill Nystatin powder.    Orders Placed This Encounter   ??? MAM MAMMO BI SCREENING DIGTL     Standing Status: Future      Number of Occurrences: 1      Standing Expiration Date:  07/03/2015     Order Specific Question:  Reason for Exam     Answer:  screening     Order Specific Question:  Is Patient Pregnant?     Answer:  No   ??? nystatin (MYCOSTATIN) powder     Sig: Apply 2-3 times daily to affected area for 1-2 weeks     Dispense:  30 g     Refill:  6

## 2014-06-06 NOTE — Addendum Note (Signed)
Addended by: Harrington ChallengerWELLARD, JESSICA A on: 06/06/2014 10:30 AM      Modules accepted: Level of Service

## 2014-06-06 NOTE — Addendum Note (Signed)
Addended by: Harrington ChallengerWELLARD, JESSICA A on: 06/06/2014 10:17 AM      Modules accepted: Level of Service

## 2014-06-06 NOTE — Progress Notes (Signed)
IUD followup note    This is a follow-up visit for Julie Daniels is a G6 P2313,  48 y.o. female WHITE OR CAUCAS[place] No LMP recorded. Patient is not currently having periods (Reason: IUD)..     She had an Mirena IUD placed on 09/11/12.  Seen last week for AE.  Strings not visible.  Has not had any unusual bleeding or pain.  Will have some spotting every few months.  Has never really been able to find strings.  She has had no fever.    Ultrasound was done today and revealed appropriate placement of the IUD in the endometrial cavity.   REPORT:   UTERUS IS ANTEVERTED, NORMAL IN SIZE AND ECHOGENICITY.  ENDOMETRIUM MEASURES 9MM IN THICKNESS. NO EVIDENCE OF MASS OR ABNORMALITY SEEN  WITHIN THE ENDOMETRIAL CAVITY.  IUD IS SEEN IN THE PROPER POSITION WITHIN THE ENDOMETRIAL CAVITY IN THE UTERINE FUNDUS.  RIGHT ADNEXA APPEARS WITHIN NORMAL LIMITS.  LEFT ADNEXA APPEARS WITHIN NORMAL LIMITS.  NO FREE FLUID SEEN IN THE CDS.    Past Medical History   Diagnosis Date   ??? Essential hypertension    ??? Diabetes mellitus      gestational DM   ??? Herpes gestationis    ??? Screening for malignant neoplasm of the cervix 03/12/12     Negative ; HPV Negative   ??? Encounter for insertion of mirena IUD Inserted 09/11/12     Past Surgical History   Procedure Laterality Date   ??? Pr cesarean delivery only  08/27/98 and 07/17/12         ??? Hx other surgical       ectopic pregnancy removal     Social History     Occupational History   ??? Not on file.     Social History Main Topics   ??? Smoking status: Former Smoker     Quit date: 09/16/2003   ??? Smokeless tobacco: Never Used   ??? Alcohol Use: No   ??? Drug Use: No   ??? Sexual Activity:     Partners: Male     CopyBirth Control/ Protection: IUD     Family History   Problem Relation Age of Onset   ??? Cancer Mother    ??? Heart Disease Mother    ??? Heart Disease Father    ??? Hypertension Father    ??? Hypertension Brother        Allergies   Allergen Reactions   ??? Tape [Adhesive] Itching     Prior to Admission medications     Medication Sig Start Date End Date Taking? Authorizing Provider   metoprolol (LOPRESSOR) 100 mg tablet  05/24/14  Yes Historical Provider   itraconazole (SPORONAX) 100 mg capsule  05/28/14  Yes Historical Provider   econazole nitrate (SPECTAZOLE) 1 % topical cream  05/23/14  Yes Historical Provider   multivitamin (ONE A DAY) tablet Take 1 Tab by mouth daily.   Yes Historical Provider   nystatin (MYCOSTATIN) powder Apply 2-3 times daily to affected area for 1-2 weeks 06/03/14  Yes Ileana Laddiane M Tashira Torre, MD   levonorgestrel (MIRENA) 20 mcg/24 hour (5 years) IUD 1 Each by IntraUTERine route once.   Yes Historical Provider   nystatin (MYCOSTATIN) topical cream Apply  to affected area two (2) times a day. 03/17/13  Yes Jimeka Balan Estrellita LudwigM Yacine Droz, MD   diphenhydrAMINE (BENADRYL) 25 mg capsule Take 25 mg by mouth every six (6) hours as needed.   Yes Historical Provider   metoprolol (LOPRESSOR) 25 mg  tablet Take  by mouth two (2) times a day.    Historical Provider        Review of Systems: History obtained from the patient  Constitutional: negative for weight loss, fever, night sweats  Breast: negative for breast lumps, nipple discharge, galactorrhea  GI: negative for change in bowel habits, abdominal pain, black or bloody stools  GU: negative for frequency, dysuria, hematuria, vaginal discharge  MSK: negative for back pain, joint pain, muscle pain  Skin: negative for itching, rash, hives  Psych: negative for anxiety, depression, change in mood      Objective:  BP 128/70 mmHg   Ht 5\' 6"  (1.676 m)   Wt 293 lb (132.904 kg)   BMI 47.31 kg/m2   Breastfeeding? No    Physical Exam:   PHYSICAL EXAMINATION    Constitutional  ?? Appearance: well-nourished, well developed, alert, in no acute distress    Neurologic/Psychiatric  ?? Mental Status:  ?? Orientation: grossly oriented to person, place and time  ?? Mood and Affect: mood normal, affect appropriate    Assessment:  Mirena IUD.  Strings not visible.  US today confirms IUD in proper position/location       Plan:   RTO for AE in 1 year.  RTO if develops any abnl bleeding or pain.

## 2014-06-06 NOTE — Patient Instructions (Signed)
Learning About Birth Control: Intrauterine Device (IUD)  What is an intrauterine device (IUD)?     The intrauterine device (IUD) is used to prevent pregnancy. It's a small, plastic, T-shaped device. Your doctor places the IUD in your uterus.  You have a choice between a hormonal IUD and a copper IUD.  The hormonal IUD prevents pregnancy by damaging or killing sperm. It also releases a type of the hormone progestin. Progestin prevents pregnancy in these ways: It thickens the mucus in the cervix. This makes it hard for sperm to travel into the uterus. It also thins the lining of the uterus, which makes it harder for a fertilized egg to attach to the uterus. Progestin can sometimes stop the ovaries from releasing an egg each month (ovulation).  There are two hormonal IUDs. One prevents pregnancy for 5 years, and the other prevents pregnancy for 3 years. Once you have it, you don't have to do anything else to prevent pregnancy.  The copper IUD is wrapped in copper wire. Copper IUDs prevent pregnancy by making the uterus and fallopian tubes produce a fluid that kills sperm.  The copper IUD prevents pregnancy for 10 years. Once you have it, you don't have to do anything else to prevent pregnancy.  A string tied to the end of the IUD hangs down through the opening of the uterus (called the cervix) into the vagina. You can check that the IUD is in place by feeling for the string. The IUD usually stays in the uterus until your doctor removes it.  How well does it work?   In the first year of use:  ?? When the hormonal IUD is used exactly as directed, fewer than 1 woman out of 100 has an unplanned pregnancy.  ?? When the copper IUD is used exactly as directed, fewer than 1 woman out of 100 has an unplanned pregnancy.  Be sure to tell your doctor about any health problems you have or medicines you take. He or she can help you choose the birth control method that is right for you.  What are the advantages of an IUD?   ?? An IUD is one of the most effective methods of birth control.  ?? It prevents pregnancy for 3 to 10 years, depending on the type. You don't have to worry about birth control during this time.  ?? It's safe to use while breast-feeding.  ?? IUDs don't contain estrogen. So you can use an IUD if you don't want to take estrogen or can't take estrogen because you have certain health problems or concerns.  ?? An IUD is convenient. It is always providing birth control. You don't need to remember to take a pill or get a shot. You don't have to interrupt sex to protect against pregnancy.  ?? A hormonal IUD may reduce heavy bleeding and cramping.  What are the disadvantages of an IUD?  ?? An IUD doesn't protect against sexually transmitted infections (STIs), such as herpes or HIV/AIDS. If you aren't sure if your sex partner might have an STI, use a condom to protect against disease.  ?? A copper IUD may cause periods with more bleeding and cramping.  ?? You have to see a doctor to have an IUD inserted and removed.  ?? You have to check to see if the string is in place.   Where can you learn more?   Go to http://www.healthwise.net/[facility]  Enter B486 in the search box to learn more about "Learning About Birth   Control: Intrauterine Device (IUD)."   ?? 2006-2015 Healthwise[employer] Inc[employer]. Care instructions adapted under license by [facility] (which disclaims liability or warranty for this information). This care instruction is for use with your licensed healthcare professional. If you have questions about a medical condition or this instruction, always ask your healthcare professional. Healthwise, Incorporated disclaims any warranty or liability for your use of this information.  Content Version: 10.5.422740; Current as of: May 19, 2013

## 2014-06-09 NOTE — Addendum Note (Signed)
Addended by: Harrington ChallengerWELLARD, JESSICA A on: 06/09/2014 10:09 AM      Modules accepted: Level of Service

## 2017-02-26 NOTE — Telephone Encounter (Signed)
Patient has not been to a gynecologist since she moved away and last annual was 06/03/14. Patient is calling to see if the Mirena was good for 5 years still or longer.      Advised still 5 years.  Needs updated AE with new GYN.

## 2017-08-25 ENCOUNTER — Encounter: Payer: Self-pay | Admitting: Obstetrics & Gynecology

## 2017-08-25 ENCOUNTER — Other Ambulatory Visit (HOSPITAL_COMMUNITY)
Admission: RE | Admit: 2017-08-25 | Discharge: 2017-08-25 | Disposition: A | Discharge status: Home / Self Care | Payer: 59 | Source: Ambulatory Visit | Attending: Obstetrics & Gynecology | Admitting: Obstetrics & Gynecology

## 2017-08-25 ENCOUNTER — Ambulatory Visit (INDEPENDENT_AMBULATORY_CARE_PROVIDER_SITE_OTHER): Payer: 59 | Admitting: Obstetrics & Gynecology

## 2017-08-25 VITALS — BP 150/76 | HR 88 | Ht 65.0 in | Wt 221.0 lb

## 2017-08-25 DIAGNOSIS — Z01419 Encounter for gynecological examination (general) (routine) without abnormal findings: Secondary | ICD-10-CM | POA: Insufficient documentation

## 2017-08-25 DIAGNOSIS — Z1239 Encounter for other screening for malignant neoplasm of breast: Secondary | ICD-10-CM

## 2017-08-25 DIAGNOSIS — Z124 Encounter for screening for malignant neoplasm of cervix: Secondary | ICD-10-CM

## 2017-08-25 DIAGNOSIS — Z1151 Encounter for screening for human papillomavirus (HPV): Secondary | ICD-10-CM

## 2017-08-25 NOTE — Patient Instructions (Signed)
Perimenopause Perimenopause is the time when your body begins to move into the menopause (no menstrual period for 12 straight months). It is a natural process. Perimenopause can begin 2-8 years before the menopause and usually lasts for 1 year after the menopause. During this time, your ovaries may or may not produce an egg. The ovaries vary in their production of estrogen and progesterone hormones each month. This can cause irregular menstrual periods, difficulty getting pregnant, vaginal bleeding between periods, and uncomfortable symptoms. What are the causes?  Irregular production of the ovarian hormones, estrogen and progesterone, and not ovulating every month. Other causes include:  Tumor of the pituitary gland in the brain.  Medical disease that affects the ovaries.  Radiation treatment.  Chemotherapy.  Unknown causes.  Heavy smoking and excessive alcohol intake can bring on perimenopause sooner. What are the signs or symptoms?  Hot flashes.  Night sweats.  Irregular menstrual periods.  Decreased sex drive.  Vaginal dryness.  Headaches.  Mood swings.  Depression.  Memory problems.  Irritability.  Tiredness.  Weight gain.  Trouble getting pregnant.  The beginning of losing bone cells (osteoporosis).  The beginning of hardening of the arteries (atherosclerosis). How is this diagnosed? Your health care provider will make a diagnosis by analyzing your age, menstrual history, and symptoms. He or she will do a physical exam and note any changes in your body, especially your female organs. Female hormone tests may or may not be helpful depending on the amount of female hormones you produce and when you produce them. However, other hormone tests may be helpful to rule out other problems. How is this treated? In some cases, no treatment is needed. The decision on whether treatment is necessary during the perimenopause should be made by you and your health care  provider based on how the symptoms are affecting you and your lifestyle. Various treatments are available, such as:  Treating individual symptoms with a specific medicine for that symptom.  Herbal medicines that can help specific symptoms.  Counseling.  Group therapy. Follow these instructions at home:  Keep track of your menstrual periods (when they occur, how heavy they are, how long between periods, and how long they last) as well as your symptoms and when they started.  Only take over-the-counter or prescription medicines as directed by your health care provider.  Sleep and rest.  Exercise.  Eat a diet that contains calcium (good for your bones) and soy (acts like the estrogen hormone).  Do not smoke.  Avoid alcoholic beverages.  Take vitamin supplements as recommended by your health care provider. Taking vitamin E may help in certain cases.  Take calcium and vitamin D supplements to help prevent bone loss.  Group therapy is sometimes helpful.  Acupuncture may help in some cases. Contact a health care provider if:  You have questions about any symptoms you are having.  You need a referral to a specialist (gynecologist, psychiatrist, or psychologist). Get help right away if:  You have vaginal bleeding.  Your period lasts longer than 8 days.  Your periods are recurring sooner than 21 days.  You have bleeding after intercourse.  You have severe depression.  You have pain when you urinate.  You have severe headaches.  You have vision problems. This information is not intended to replace advice given to you by your health care provider. Make sure you discuss any questions you have with your health care provider. Document Released: 01/09/2005 Document Revised: 05/09/2016 Document Reviewed: 07/01/2013 Elsevier Interactive   Patient Education  2017 Elsevier Inc.  

## 2017-08-25 NOTE — Progress Notes (Signed)
Subjective:     Kristen Ortega is a 51 y.o. female here for a routine exam. [phone] (had preterm twins and 1 of the twins was still ron) Current complaints: pt for annual GYN exam . Pt had an LnIUD placed in 2013. Pt had an IUD for contraception.       Gynecologic History No LMP recorded. Patient is not currently having periods (Reason: IUD). Contraception: IUD Last Pap: 2015. Results were: normal Last mammogram: 2015. Results were: normal  Obstetric History OB History  Gravida Para Term Preterm AB Living  5 4 1 1 1     SAB TAB Ectopic Multiple Live Births      1 1 4     # Outcome Date GA Lbr Len/2nd Weight Sex Delivery Anes PTL Lv  5 Para           4 Preterm      CS-LTranv     3 Term      Vag-Spont     2 Para      Vag-Spont     1 Ectopic                The following portions of the patient's history were reviewed and updated as appropriate: allergies, current medications, past family history, past medical history, past social history, past surgical history and problem list.  Review of Systems Pertinent items are noted in HPI.    Objective:  Ht 5\' 5"  (1.651 m)   Wt 221 lb (100.2 kg)   BMI 36.78 kg/m  General Appearance:    Alert, cooperative, no distress, appears stated age  Head:    Normocephalic, without obvious abnormality, atraumatic  Eyes:    conjunctiva/corneas clear, EOM's intact, both eyes  Ears:    Normal external ear canals, both ears  Nose:   Nares normal, septum midline, mucosa normal, no drainage    or sinus tenderness  Throat:   Lips, mucosa, and tongue normal; teeth and gums normal  Neck:   Supple, symmetrical, trachea midline, no adenopathy;    thyroid:  no enlargement/tenderness/nodules  Back:     Symmetric, no curvature, ROM normal, no CVA tenderness  Lungs:     Clear to auscultation bilaterally, respirations unlabored  Chest Wall:    No tenderness or deformity   Heart:    Regular rate and rhythm, S1 and S2 normal, no murmur, rub   or gallop  Breast  Exam:    No tenderness, masses, or nipple abnormality  Abdomen:     Soft, non-tender, bowel sounds active all four quadrants,    no masses, no organomegaly  Genitalia:    Normal female without lesion, discharge or tenderness: IUD strings noted     Extremities:   Extremities normal, atraumatic, no cyanosis or edema  Pulses:   2+ and symmetric all extremities  Skin:   Skin color, texture, turgor normal, no rashes or lesions     Assessment:    Healthy female exam.   LnIUD for 5 years- discussed with pt option of leaving g IUD for 7 years due to perimenopausal state. She is excited and wants to keep her IUD.    Plan:    Follow up in: 1 year.    Screening mammogram F/u PAP   Shelbia Scinto L. Harraway-Smith, M.D., [place]  Keep IUD for 7 years Screening mammogram

## 2017-08-25 NOTE — Progress Notes (Signed)
Mirena is due to be changed this month per patient. Kathrene Alu RNBSN

## 2017-08-28 LAB — CYTOLOGY - PAP
Diagnosis: NEGATIVE
HPV (WINDOPATH): NOT DETECTED

## 2017-09-02 ENCOUNTER — Ambulatory Visit (HOSPITAL_BASED_OUTPATIENT_CLINIC_OR_DEPARTMENT_OTHER)
Admission: RE | Admit: 2017-09-02 | Discharge: 2017-09-02 | Disposition: A | Discharge status: Home / Self Care | Payer: 59 | Source: Ambulatory Visit | Attending: Obstetrics & Gynecology | Admitting: Obstetrics & Gynecology

## 2017-09-02 DIAGNOSIS — Z1231 Encounter for screening mammogram for malignant neoplasm of breast: Secondary | ICD-10-CM | POA: Diagnosis not present

## 2017-09-02 DIAGNOSIS — Z1239 Encounter for other screening for malignant neoplasm of breast: Secondary | ICD-10-CM

## 2017-09-16 ENCOUNTER — Telehealth: Payer: Self-pay

## 2017-09-16 NOTE — Telephone Encounter (Signed)
Pre visit call completed 

## 2017-09-16 NOTE — Progress Notes (Addendum)
[place] at [facility] [address], [place], [place] [address] [phone]  Date:  09/17/2017   Name:  Kristen Ortega   DOB:  02/21/1966   MRN:  [id number]  PCP:  Darreld Mclean, MD    Chief Complaint: Establish Care   History of Present Illness:  Kristen Ortega is a 51 y.o. very pleasant female patient who presents with the following:  Here today as a new patient to my practice- she has seen Dr. Ihor Dow for her GYN care.  From her recent note:  Kristen Ortega is a 51 y.o. female here for a routine exam. [phone] (had preterm twins and 1 of the twins was still ron) Current complaints: pt for annual GYN exam . Pt had an LnIUD placed in 2013. Pt had an IUD for contraception.       Gynecologic History No LMP recorded. Patient is not currently having periods (Reason: IUD). Contraception: IUD Last Pap: 2015. Results were: normal Last mammogram: 2015. Results were: normal  Colon cancer screening: she is interested in having cologuard, will order for her todya Labs: none in computer Flu shot: today Tdap: done 07/2012 after she delivered  BP Readings from Last 3 Encounters:  09/17/17 (!) 152/90  08/25/17 (!) 150/76   Her husband got a new job that has insurance so she is able to get back into health care She is a Research scientist (medical), they have a 79 yo daughter and older children as well- 79 yo still at home, and 2 older kids with their own families  She did have GDM when she was pregnant with her 51yo, and they had to monitor her BP as well although I do not believe she was on BP medication No operations except for C sections She is not on any medications currently Admits that she has not really gone to doctors or had her BP checked over the last few years  She enjoys watching TV, messing around on the computer when she has a free moment  She has not had any labs in a while- would like to do today She had a bagel with CC and  smoked salmon this am   About a year ago she was leaning over to change a diaper and had onset of left sided back pain- it has continued to hurt and can make it hard for her to stand up straight She has been using a cane to help herself stand up straight for the last several months She does not have weakness or numbness into her legs Otherwise no acute injury of her back  Patient Active Problem List   Diagnosis Date Noted  . Benign essential HTN 09/17/2017  . Scoliosis of thoracolumbar spine 09/17/2017    Past Medical History:  Diagnosis Date  . Vaginal Pap smear, abnormal     Past Surgical History:  Procedure Laterality Date  . CESAREAN SECTION    . COLPOSCOPY      Social History  Substance Use Topics  . Smoking status: Former Smoker    Years: 20.00  . Smokeless tobacco: Never Used  . Alcohol use Yes     Comment: occasional    Family History  Problem Relation Age of Onset  . Stroke Paternal Grandmother   . Hypertension Father   . Cancer Mother        lymphoma  . Diabetes Neg Hx     No Known Allergies  Medication list has been  reviewed and updated.  Current Outpatient Prescriptions on File Prior to Visit  Medication Sig Dispense Refill  . ibuprofen (ADVIL,MOTRIN) 200 MG tablet Take 200 mg by mouth every 6 (six) hours as needed.    . Omega-3 Fatty Acids (FISH OIL) 500 MG CAPS Take by mouth.    . ranitidine (ZANTAC) 150 MG tablet Take 150 mg by mouth 2 (two) times daily.     No current facility-administered medications on file prior to visit.     Review of Systems:  As per HPI- otherwise negative.   Physical Examination: Vitals:   09/17/17 1357 09/17/17 1421  BP: (!) 138/116 (!) 152/90  Pulse: 74   Temp: 98 F (36.7 C)   SpO2: 97%    Vitals:   09/17/17 1357  Weight: 220 lb 6.4 oz (100 kg)  Height: 5\' 4"  (1.626 m)   Body mass index is 37.83 kg/m. Ideal Body Weight: Weight in (lb) to have BMI = 25: 145.3  GEN: WDWN, NAD, Non-toxic, A & O x 3,  obese, otherwise looks well HEENT: Atraumatic, Normocephalic. Neck supple. No masses, No LAD.  Bilateral TM wnl, oropharynx normal.  PEERL,EOMI.   Ears and Nose: No external deformity. CV: RRR, No M/G/R. No JVD. No thrill. No extra heart sounds. PULM: CTA B, no wheezes, crackles, rhonchi. No retractions. No resp. distress. No accessory muscle use. ABD: S, NT, ND, +BS. No rebound. No HSM. EXTR: No c/c/e NEURO Normal gait.  PSYCH: Normally interactive. Conversant. Not depressed or anxious appearing.  Calm demeanor.  She has significant thoracolumbar scoliosis, quite apparent with lumbar flexion Normal BLE strength, but she does have a hard time doing a toe raise on the left    Assessment and Plan: Benign essential HTN - Plan: amLODipine (NORVASC) 5 MG tablet  Need for influenza vaccination - Plan: Flu Vaccine QUAD 6+ mos PF IM (Fluarix Quad PF)  Screening for deficiency anemia - Plan: CBC  Screening for diabetes mellitus - Plan: Comprehensive metabolic panel, Hemoglobin A1c  Screening for hyperlipidemia - Plan: Lipid panel  Scoliosis of thoracolumbar spine, unspecified scoliosis type - Plan: DG Lumbar Spine 2-3 Views, DG Thoracic Spine 2 View  Chronic left-sided low back pain without sciatica - Plan: DG Thoracic Spine 2 View  Screening for colon cancer   Here today to establish care Labs pending as above Flu shot today Will have her start on 5 mg of amlodipine for HTN She will check her BP at the drug store in about a week and report to me Await labs and x-rays today   Signed Lamar Blinks, MD 10/5 Received her labs and x-ray reports, gave her a call Had to [facility]- will go ahead and order urology consultation Otherwise will call her back to discuss further  Called her back and reached on 10/7- went over her films and labs We are going to refer her to urology to see if her stone may be the source of her back pain.  However, if not will continue to look at her degenerative  changes See letter to pt Will start on a cholesterol medication for dyslipidemia  Dg Thoracic Spine 2 View  Result Date: 09/18/2017 CLINICAL DATA:  Chronic left-sided lower back pain. EXAM: THORACIC SPINE 2 VIEWS COMPARISON:  None. FINDINGS: Prominent lower and mid thoracic degenerative disc narrowing and spondylosis. There is a compensatory dextrocurvature. No fracture deformity or evidence of bone lesion. No listhesis. IMPRESSION: Prominent spondylosis and degenerative disc narrowing in the mid and lower thoracic spine.  Mild thoracic dextroscoliosis. Electronically Signed   By: Monte Fantasia M.D.   On: 09/18/2017 08:16   Dg Lumbar Spine 2-3 Views  Result Date: 09/18/2017 CLINICAL DATA:  Chronic left-sided lower back pain. EXAM: LUMBAR SPINE - 2-3 VIEW COMPARISON:  None. FINDINGS: There is mild levoscoliosis. Moderate to advanced disc degeneration in the lumbar spine, with disc height loss and vacuum phenomenon primarily seen at L3-4 to L5-S1. Facet arthropathy with moderate to advanced hypertrophy at L3-4 and below. No evidence of fracture, endplate erosion, or bone lesion. Extensive left-sided nephrolithiasis, including a 3 cm stone at the level of the hilum. Confluent stones in the lower pole with casting of a patulous caliceal system. Right-sided calcifications appear more ventral than expected for the kidney, which could be malpositioned. IUD in place. IMPRESSION: 1. Advanced for age disc and facet degeneration with levoscoliosis. No acute or focal osseous finding. 2. Extensive left nephrolithiasis, including 3 cm stone at the level of the pelvis. There is casting of the lower pole calyces which are dilated. 3. Probable the extensive right-sided nephrolithiasis, but not normally localizing to the right renal fossa on the lateral view, question malpositioned right kidney. Recommend urology follow-up. Electronically Signed   By: Monte Fantasia M.D.   On: 09/18/2017 08:14   Mm Digital Screening  Bilateral  Result Date: 09/02/2017 CLINICAL DATA:  Screening. EXAM: DIGITAL SCREENING BILATERAL MAMMOGRAM WITH CAD COMPARISON:  None. ACR Breast Density Category b: There are scattered areas of fibroglandular density. FINDINGS: There are no findings suspicious for malignancy. Images were processed with CAD. IMPRESSION: No mammographic evidence of malignancy. A result letter of this screening mammogram will be mailed directly to the patient. RECOMMENDATION: Screening mammogram in one year. (Code:SM-B-01Y) BI-RADS CATEGORY  1: Negative. Electronically Signed   By: Lajean Manes M.D.   On: 09/02/2017 11:47   Results for orders placed or performed in visit on 09/17/17  CBC  Result Value Ref Range   WBC 9.8 4.0 - 10.5 K/uL   RBC 5.22 (H) 3.87 - 5.11 Mil/uL   Platelets 149.0 (L) 150.0 - 400.0 K/uL   Hemoglobin 14.9 12.0 - 15.0 g/dL   HCT 44.9 36.0 - 46.0 %   MCV 86.0 78.0 - 100.0 fl   MCHC 33.2 30.0 - 36.0 g/dL   RDW 14.0 11.5 - 15.5 %  Comprehensive metabolic panel  Result Value Ref Range   Sodium 142 135 - 145 mEq/L   Potassium 3.3 (L) 3.5 - 5.1 mEq/L   Chloride 108 96 - 112 mEq/L   CO2 26 19 - 32 mEq/L   Glucose, Bld 115 (H) 70 - 99 mg/dL   BUN 16 6 - 23 mg/dL   Creatinine, Ser 0.85 0.40 - 1.20 mg/dL   Total Bilirubin 0.5 0.2 - 1.2 mg/dL   Alkaline Phosphatase 61 39 - 117 U/L   AST 15 0 - 37 U/L   ALT 14 0 - 35 U/L   Total Protein 7.1 6.0 - 8.3 g/dL   Albumin 3.9 3.5 - 5.2 g/dL   Calcium 9.2 8.4 - 10.5 mg/dL   GFR 74.93 >60.00 mL/min  Hemoglobin A1c  Result Value Ref Range   Hgb A1c MFr Bld 5.4 4.6 - 6.5 %  Lipid panel  Result Value Ref Range   Cholesterol 187 0 - 200 mg/dL   Triglycerides 219.0 (H) 0.0 - 149.0 mg/dL   HDL 32.10 (L) >39.00 mg/dL   VLDL 43.8 (H) 0.0 - 40.0 mg/dL   Total CHOL/HDL Ratio 6  NonHDL 155.12   LDL cholesterol, direct  Result Value Ref Range   Direct LDL 126.0 mg/dL

## 2017-09-17 ENCOUNTER — Ambulatory Visit (INDEPENDENT_AMBULATORY_CARE_PROVIDER_SITE_OTHER): Payer: 59 | Admitting: Family Medicine

## 2017-09-17 ENCOUNTER — Ambulatory Visit (HOSPITAL_BASED_OUTPATIENT_CLINIC_OR_DEPARTMENT_OTHER)
Admission: RE | Admit: 2017-09-17 | Discharge: 2017-09-17 | Disposition: A | Discharge status: Home / Self Care | Payer: 59 | Source: Ambulatory Visit | Attending: Family Medicine | Admitting: Family Medicine

## 2017-09-17 VITALS — BP 152/90 | HR 74 | Temp 98.0°F | Ht 64.0 in | Wt 220.4 lb

## 2017-09-17 DIAGNOSIS — M545 Low back pain, unspecified: Secondary | ICD-10-CM

## 2017-09-17 DIAGNOSIS — N200 Calculus of kidney: Secondary | ICD-10-CM | POA: Insufficient documentation

## 2017-09-17 DIAGNOSIS — I10 Essential (primary) hypertension: Secondary | ICD-10-CM | POA: Insufficient documentation

## 2017-09-17 DIAGNOSIS — Z131 Encounter for screening for diabetes mellitus: Secondary | ICD-10-CM | POA: Diagnosis not present

## 2017-09-17 DIAGNOSIS — Z23 Encounter for immunization: Secondary | ICD-10-CM

## 2017-09-17 DIAGNOSIS — M4185 Other forms of scoliosis, thoracolumbar region: Secondary | ICD-10-CM | POA: Diagnosis present

## 2017-09-17 DIAGNOSIS — M5134 Other intervertebral disc degeneration, thoracic region: Secondary | ICD-10-CM | POA: Diagnosis not present

## 2017-09-17 DIAGNOSIS — G8929 Other chronic pain: Secondary | ICD-10-CM

## 2017-09-17 DIAGNOSIS — M47814 Spondylosis without myelopathy or radiculopathy, thoracic region: Secondary | ICD-10-CM | POA: Diagnosis not present

## 2017-09-17 DIAGNOSIS — M5136 Other intervertebral disc degeneration, lumbar region: Secondary | ICD-10-CM | POA: Diagnosis not present

## 2017-09-17 DIAGNOSIS — M419 Scoliosis, unspecified: Secondary | ICD-10-CM

## 2017-09-17 DIAGNOSIS — Z1211 Encounter for screening for malignant neoplasm of colon: Secondary | ICD-10-CM | POA: Diagnosis not present

## 2017-09-17 DIAGNOSIS — Z130 Encounter for screening for diseases of the blood and blood-forming organs and certain disorders involving the immune mechanism: Secondary | ICD-10-CM | POA: Diagnosis not present

## 2017-09-17 DIAGNOSIS — E785 Hyperlipidemia, unspecified: Secondary | ICD-10-CM

## 2017-09-17 DIAGNOSIS — Z13220 Encounter for screening for lipoid disorders: Secondary | ICD-10-CM | POA: Diagnosis not present

## 2017-09-17 MED ORDER — AMLODIPINE BESYLATE 5 MG PO TABS: 5.0000 mg | ORAL_TABLET | Freq: Every day | ORAL | 6 refills | Status: DC

## 2017-09-17 NOTE — Patient Instructions (Addendum)
It was nice to meet you today!  I will be in touch with your labs It does seem that you have mild hypertension- we are going to start 5 mg of amlodipine- once a day-for your blood pressure.  Please check your BP at the drug store in about a week and let me know how it looks You seem to have significant scoliosis in your back- we are going to get some x-rays for you today to see if we can determine the cause of your back pain

## 2017-09-18 LAB — COMPREHENSIVE METABOLIC PANEL
ALT: 14 U/L (ref 0–35)
AST: 15 U/L (ref 0–37)
Albumin: 3.9 g/dL (ref 3.5–5.2)
Alkaline Phosphatase: 61 U/L (ref 39–117)
BUN: 16 mg/dL (ref 6–23)
CHLORIDE: 108 meq/L (ref 96–112)
CO2: 26 mEq/L (ref 19–32)
CREATININE: 0.85 mg/dL (ref 0.40–1.20)
Calcium: 9.2 mg/dL (ref 8.4–10.5)
GFR: 74.93 mL/min (ref >60.00)
GLUCOSE: 115 mg/dL — AB (ref 70–99)
POTASSIUM: 3.3 meq/L — AB (ref 3.5–5.1)
SODIUM: 142 meq/L (ref 135–145)
TOTAL PROTEIN: 7.1 g/dL (ref 6.0–8.3)
Total Bilirubin: 0.5 mg/dL (ref 0.2–1.2)

## 2017-09-18 LAB — CBC
HEMATOCRIT: 44.9 % (ref 36.0–46.0)
Hemoglobin: 14.9 g/dL (ref 12.0–15.0)
MCHC: 33.2 g/dL (ref 30.0–36.0)
MCV: 86 fl (ref 78.0–100.0)
Platelets: 149 10*3/uL — ABNORMAL LOW (ref 150.0–400.0)
RBC: 5.22 Mil/uL — AB (ref 3.87–5.11)
RDW: 14 % (ref 11.5–15.5)
WBC: 9.8 10*3/uL (ref 4.0–10.5)

## 2017-09-18 LAB — LIPID PANEL
Cholesterol: 187 mg/dL (ref 0–200)
HDL: 32.1 mg/dL — AB (ref >39.00)
NONHDL: 155.12
Total CHOL/HDL Ratio: 6
Triglycerides: 219 mg/dL — ABNORMAL HIGH (ref 0.0–149.0)
VLDL: 43.8 mg/dL — ABNORMAL HIGH (ref 0.0–40.0)

## 2017-09-18 LAB — HEMOGLOBIN A1C: Hgb A1c MFr Bld: 5.4 % (ref 4.6–6.5)

## 2017-09-18 LAB — LDL CHOLESTEROL, DIRECT: Direct LDL: 126 mg/dL

## 2017-09-19 NOTE — Addendum Note (Signed)
Addended by: Lamar Blinks C on: 09/19/2017 12:16 PM   Modules accepted: Orders

## 2017-09-21 MED ORDER — LOVASTATIN 20 MG PO TABS: 20.0000 mg | ORAL_TABLET | Freq: Every day | ORAL | 3 refills | Status: DC

## 2017-09-21 NOTE — Addendum Note (Signed)
Addended by: Lamar Blinks C on: 09/21/2017 01:33 PM   Modules accepted: Orders

## 2017-09-22 ENCOUNTER — Telehealth: Payer: Self-pay | Admitting: Family Medicine

## 2017-09-22 MED ORDER — TRAMADOL HCL 50 MG PO TABS: 50.0000 mg | ORAL_TABLET | Freq: Three times a day (TID) | ORAL | 0 refills | Status: DC | PRN

## 2017-09-22 NOTE — Telephone Encounter (Signed)
Called her back- she is using OTC ibuprofen 800 3-4x a day and this is not controlling her pain.  Will step her up to tramadol - advised that this is a mild narcotic and can cause drowsiness- she states understanding.  Can use with ibuprofen if needed   NCCSR: no entries noted

## 2017-09-22 NOTE — Telephone Encounter (Signed)
°  Relation to pt: self  Call back number:[phone] [id number] [place], [place] - [address] [phone] (Phone) [phone] (Fax)     Reason for call:  Patient was referred to [facility] for chronic left-sided low back pain without sciatica, patient states urologist unable to see her until 10/25, patient requesting medication for pain, please advise

## 2017-10-09 ENCOUNTER — Other Ambulatory Visit: Payer: Self-pay | Admitting: *Deleted

## 2017-10-09 DIAGNOSIS — G8929 Other chronic pain: Secondary | ICD-10-CM

## 2017-10-09 DIAGNOSIS — M545 Low back pain: Principal | ICD-10-CM

## 2017-10-09 NOTE — Telephone Encounter (Signed)
Copied from [place] #[id number]. Topic: Quick Communication - See Telephone Encounter >> Oct 09, 2017 11:47 AM Eli Phillips, NT wrote: CRM for notification. See Telephone encounter for: Medication  refill   10/09/17.  Patient called and states she needs a refill of her tramadol that was prescribed to her. She also states that she was referred to a urologist and the Dr states her back pain has nothing to do with urology . Patient states that it would need to be manage by her PCP. She is requesting a call back . Her contact number is  [phone]. Please advise. Thank you

## 2017-10-10 MED ORDER — TRAMADOL HCL 50 MG PO TABS: 50.0000 mg | ORAL_TABLET | Freq: Three times a day (TID) | ORAL | 0 refills | Status: DC | PRN

## 2017-10-10 NOTE — Telephone Encounter (Signed)
Called in tramadol, need to call pt later today

## 2017-10-10 NOTE — Telephone Encounter (Signed)
Called pt back-  She saw urology, [place], on 10/25; he does not feel that her back pain is caused by her stones. They may still try and remove her stones, but this will be pretty involved.  She is having a CT done next week to get more info  She notes that although the tramadol does help, her back issues are still such that she cannot really stand up straight without support and cannot walk very far.  She would like to see PM and R which is a quite reasonable next step.  I will arrange this for her

## 2017-10-17 ENCOUNTER — Other Ambulatory Visit: Payer: Self-pay | Admitting: Family Medicine

## 2017-10-21 ENCOUNTER — Other Ambulatory Visit: Payer: Self-pay | Admitting: Emergency Medicine

## 2017-10-21 MED ORDER — TRAMADOL HCL 50 MG PO TABS: 50.0000 mg | ORAL_TABLET | Freq: Three times a day (TID) | ORAL | 1 refills | Status: DC | PRN

## 2017-10-21 NOTE — Telephone Encounter (Signed)
Patient checking status.

## 2017-11-02 ENCOUNTER — Other Ambulatory Visit: Payer: Self-pay | Admitting: Family Medicine

## 2017-11-03 ENCOUNTER — Other Ambulatory Visit: Payer: Self-pay | Admitting: Family Medicine

## 2017-11-03 NOTE — Telephone Encounter (Signed)
Patient requesting refill on tramadol[facility] [place] [address]

## 2017-11-04 MED ORDER — TRAMADOL HCL 50 MG PO TABS: 50.0000 mg | ORAL_TABLET | Freq: Three times a day (TID) | ORAL | 1 refills | Status: DC | PRN

## 2017-12-10 ENCOUNTER — Other Ambulatory Visit: Payer: Self-pay | Admitting: Family Medicine

## 2017-12-10 NOTE — Telephone Encounter (Signed)
Called pt to discuss I last gave her an rx for tramadol on 11/29- 30 pills. She has gotten 20 hydrocodone and a total of 40 oxycodone from her urologist since then, around the time of her kidney procedures. She is no longer taking these medications, and requests tramadol for her chronic baseline back pain.  Will refill, reminded her not to combine pain medications and she agrees, states understanding

## 2017-12-10 NOTE — Telephone Encounter (Signed)
Pt is requesting refill on tramadol.

## 2017-12-16 DIAGNOSIS — C801 Malignant (primary) neoplasm, unspecified: Secondary | ICD-10-CM

## 2017-12-16 HISTORY — DX: Malignant (primary) neoplasm, unspecified: C80.1

## 2018-01-03 MED ORDER — OXYCODONE-ACETAMINOPHEN 5-325 MG PO TABS
2.00 | ORAL_TABLET | ORAL | Status: DC
Start: ? — End: 2018-01-03

## 2018-01-03 MED ORDER — AMLODIPINE BESYLATE 5 MG PO TABS
5.00 | ORAL_TABLET | ORAL | Status: DC
Start: 2018-01-04 — End: 2018-01-03

## 2018-01-03 MED ORDER — OXYBUTYNIN CHLORIDE 5 MG PO TABS
5.00 | ORAL_TABLET | ORAL | Status: DC
Start: ? — End: 2018-01-03

## 2018-01-03 MED ORDER — FAMOTIDINE 20 MG PO TABS
20.00 | ORAL_TABLET | ORAL | Status: DC
Start: 2018-01-03 — End: 2018-01-03

## 2018-01-03 MED ORDER — DOCUSATE SODIUM 100 MG PO CAPS
100.00 | ORAL_CAPSULE | ORAL | Status: DC
Start: 2018-01-03 — End: 2018-01-03

## 2018-01-03 MED ORDER — NITROFURANTOIN MONOHYD MACRO 100 MG PO CAPS
100.00 | ORAL_CAPSULE | ORAL | Status: DC
Start: 2018-01-04 — End: 2018-01-03

## 2018-01-03 MED ORDER — ZOLPIDEM TARTRATE 5 MG PO TABS
5.00 | ORAL_TABLET | ORAL | Status: DC
Start: ? — End: 2018-01-03

## 2018-01-03 MED ORDER — MORPHINE SULFATE 4 MG/ML IJ SOLN
2.00 | INTRAMUSCULAR | Status: DC
Start: ? — End: 2018-01-03

## 2018-01-03 MED ORDER — ACETAMINOPHEN 325 MG PO TABS
325.00 | ORAL_TABLET | ORAL | Status: DC
Start: ? — End: 2018-01-03

## 2018-01-03 MED ORDER — OXYCODONE-ACETAMINOPHEN 5-325 MG PO TABS
1.00 | ORAL_TABLET | ORAL | Status: DC
Start: ? — End: 2018-01-03

## 2018-01-03 MED ORDER — FERROUS SULFATE 325 (65 FE) MG PO TABS
325.00 | ORAL_TABLET | ORAL | Status: DC
Start: 2018-01-03 — End: 2018-01-03

## 2018-01-16 ENCOUNTER — Other Ambulatory Visit: Payer: Self-pay | Admitting: Family Medicine

## 2018-01-16 MED ORDER — TRAMADOL HCL 50 MG PO TABS: 50.0000 mg | ORAL_TABLET | Freq: Three times a day (TID) | ORAL | 1 refills | Status: DC | PRN

## 2018-01-16 NOTE — Telephone Encounter (Signed)
Pt requesting refill on Tramadol.

## 2018-01-16 NOTE — Telephone Encounter (Signed)
Copied from [place]. Topic: Quick Communication - Rx Refill/Question >> Jan 16, 2018 11:02 AM Burnis Medin, NT wrote: Medication: traMADol (ULTRAM) 50 MG tablet   Has the patient contacted their pharmacy? Yes   (Agent: If no, request that the patient contact the pharmacy for the refill.)   Preferred Pharmacy (with phone number or street name): [id number] [place], [place] - [address] [phone] (Phone) [phone] (Fax)     Agent: Please be advised that RX refills may take up to 3 business days. We ask that you follow-up with your pharmacy.

## 2018-01-16 NOTE — Telephone Encounter (Signed)
Ultram refill request  It looks like all of her care is at [facility].

## 2018-01-16 NOTE — Telephone Encounter (Signed)
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  Now recovering at home.  She was using oxycodone in the peri-operative period but is now finished with this.  Is requesting tramadol for her baseline back pain.   Reviewed her [place] Will refill tramadol for her Reminded her not to combine with any other pain medication Will need to have her come in for a recheck in next month or so

## 2018-01-16 NOTE — Addendum Note (Signed)
Addended by: Lamar Blinks C on: 01/16/2018 01:05 PM   Modules accepted: Orders

## 2018-02-17 ENCOUNTER — Telehealth: Payer: Self-pay | Admitting: Family Medicine

## 2018-02-17 NOTE — Telephone Encounter (Signed)
Copied from [place]. Topic: Referral - Question >> Feb 13, 2018  4:59 PM [place], Laban Emperor wrote: Reason for CRM: Estill Bamberg with [place] states pt primary insurance now is [employer] and she has 2nd [place]. A referral was sent to them on 10/10/17 but b/c the pt has Medicaid as a [employer] they now need addt'l information. (NPI # and number of visit pt can have)  Please f/u @ [phone] [id number]

## 2018-02-17 NOTE — Telephone Encounter (Signed)
Called Kristen Ortega back- patient recently switched to [place] but has medicaid as a secondary, and wishes to use her medicaid for ortho services to save on out of pocket expense- she has [place] access which we do not accept at [facility].   This means I will not be able to provide referral for this service Called pt and LMOM explaining this situation.  She will need to go through her medicaid PCP to obtain this referral and NPI number

## 2018-03-04 ENCOUNTER — Telehealth: Payer: Self-pay | Admitting: Family Medicine

## 2018-03-04 NOTE — Telephone Encounter (Signed)
Copied from [place] [phone]. Topic: Quick Communication - See Telephone Encounter >> Mar 04, 2018  4:50 PM Vernona Rieger wrote: CRM for notification. See Telephone encounter for:   03/04/18.  Patient said that she once before had an insurance that did not cover the "colo-guard" and now she has a new insurance. They advised her that It could be mailed to her but they need a PA on this to why it is medically important she does this. Fax is [phone]   [employer] is the new insurance

## 2018-03-09 NOTE — Telephone Encounter (Signed)
Called and spoke with her insurance- cologuard is covered for her and nothing further is needed from our end. Will alert pt and forward to [facility] to order cologuard please  [place], please order cologuard for this pt for colon cancer screening

## 2018-03-09 NOTE — Telephone Encounter (Signed)
Cologuard ordered through [facility]. Order confirmation: [id number].

## 2018-03-09 NOTE — Telephone Encounter (Signed)
Called her back- we do not generally do a prior auth for cologuard.  [phone]  What union- local union #[employer] [place] Under her husband's name Spoke with [name] [name]  [birth year]

## 2018-03-18 ENCOUNTER — Other Ambulatory Visit: Payer: Self-pay | Admitting: Family Medicine

## 2018-03-18 NOTE — Telephone Encounter (Signed)
Reviewed [place]- ok to refill but office visit is due

## 2018-03-18 NOTE — Telephone Encounter (Signed)
Received refill request for traMADol(ULTRAM) 50 MG tablet. Last office visit 09/17/17 and last refill 01/16/18.

## 2018-03-23 ENCOUNTER — Telehealth: Payer: Self-pay | Admitting: Family Medicine

## 2018-03-23 NOTE — Telephone Encounter (Signed)
Got a notice from Medicaid that for controlled rx- opiods, benzos- must [facility] [address] [facility] [place] and my NPI must be on the rx or they won't fill the rx

## 2018-04-12 ENCOUNTER — Other Ambulatory Visit: Payer: Self-pay | Admitting: Family Medicine

## 2018-04-12 DIAGNOSIS — I10 Essential (primary) hypertension: Secondary | ICD-10-CM

## 2018-09-23 ENCOUNTER — Telehealth: Payer: Self-pay | Admitting: *Deleted

## 2018-09-23 NOTE — Telephone Encounter (Signed)
Received Cologuard Order Cancellation: [phone]; order has been Cancelled because it has been Inactive, and has exceeded the 365 days from the Initial order; forwarded to provider/SLS 10/09

## 2019-01-16 NOTE — Progress Notes (Addendum)
[place] at [facility] [address] [place], [place] [address] [phone]  Date:  01/18/2019   Name:  Kristen Ortega   DOB:  [birth year]   MRN:  [id number]  PCP:  Darreld Mclean, MD    Chief Complaint: Hypertension (blood pressure check)   History of Present Illness:  Kristen Ortega is a 53 y.o. very pleasant female patient who presents with the following:  Following up on BP today I last saw her in October of 2018 as a new patient.  She has rarely visited doctors She had her last child quite late in life, at about age 50 She did have GDM during this pregnancy   Due for complete labs- she is not fasting  Colon cancer screening: no family history.  We ordered cologuard for her last year but insurance did not cover it.  She has a new plan now, but is not sure if this plan covers Cologuard either.  In any case, she would like to defer any screening today as she might be moving to [place] soon for her husband's job Flu shot: give today Shingrix:  She will hold off today  She does not check her home BP She is on just amlodipine 5 mg  BP Readings from Last 3 Encounters:  01/18/19 (!) 154/84  09/17/17 (!) 152/90  08/25/17 (!) 150/76   She is taking once a day H2 blocker She is not taking her cholesterol med   Patient Active Problem List   Diagnosis Date Noted  . Benign essential HTN 09/17/2017  . Scoliosis of thoracolumbar spine 09/17/2017    Past Medical History:  Diagnosis Date  . Vaginal Pap smear, abnormal     Past Surgical History:  Procedure Laterality Date  . CESAREAN SECTION    . COLPOSCOPY      Social History   Tobacco Use  . Smoking status: Former Smoker    Years: 20.00  . Smokeless tobacco: Never Used  Substance Use Topics  . Alcohol use: Yes    Comment: occasional  . Drug use: No    Family History  Problem Relation Age of Onset  . Stroke Paternal Grandmother   . Hypertension Father   .  Cancer Mother        lymphoma  . Diabetes Neg Hx     No Known Allergies  Medication list has been reviewed and updated.  Current Outpatient Medications on File Prior to Visit  Medication Sig Dispense Refill  . amLODipine (NORVASC) 5 MG tablet TAKE 1 TABLET BY MOUTH EVERY DAY 30 tablet 6  . ibuprofen (ADVIL,MOTRIN) 200 MG tablet Take 200 mg by mouth every 6 (six) hours as needed.    . lovastatin (MEVACOR) 20 MG tablet Take 1 tablet (20 mg total) by mouth at bedtime. 90 tablet 3  . nystatin-triamcinolone (MYCOLOG II) cream Apply 1 application topically 2 (two) times daily.    . Omega-3 Fatty Acids (FISH OIL) 500 MG CAPS Take by mouth.    . ranitidine (ZANTAC) 150 MG tablet Take 150 mg by mouth 2 (two) times daily.     No current facility-administered medications on file prior to visit.     Review of Systems:  As per HPI- otherwise negative.'  Physical Examination: Vitals:   01/18/19 1313  BP: (!) 154/84  Pulse: 83  Resp: 16  Temp: 98 F (36.7 C)  SpO2: 95%   Vitals:   01/18/19 1313  Weight: 189  lb (85.7 kg)  Height: 5\' 4"  (1.626 m)   Body mass index is 32.44 kg/m. Ideal Body Weight: Weight in (lb) to have BMI = 25: 145.3  GEN: WDWN, NAD, Non-toxic, A & O x 3, obese, looks well HEENT: Atraumatic, Normocephalic. Neck supple. No masses, No LAD.  Bilateral TM wnl, oropharynx normal.  PEERL,EOMI.   Ears and Nose: No external deformity. CV: RRR, No M/G/R. No JVD. No thrill. No extra heart sounds. PULM: CTA B, no wheezes, crackles, rhonchi. No retractions. No resp. distress. No accessory muscle use. ABD: S, NT, ND EXTR: No c/c/e NEURO Normal gait.  PSYCH: Normally interactive. Conversant. Not depressed or anxious appearing.  Calm demeanor.    Assessment and Plan: Screening for deficiency anemia  Screening for diabetes mellitus - Plan: Comprehensive metabolic panel, Hemoglobin A1c  Screening for hyperlipidemia - Plan: Lipid panel  Benign essential HTN - Plan: CBC,  Comprehensive metabolic panel, amLODipine (NORVASC) 10 MG tablet  Screening for colon cancer  Immunization due  Following up today, I have not seen her in some time Labs plan as above- Will plan further follow- up pending labs.  Blood pressure is still high, will increase amlodipine to 10 mg daily  encouraged colon cancer screening, she is still thinking about this-she may be moving to [place] soon She will let me know if this ends up happening  Flu shot given today, discussed Shingrix  She notes chronic back pain from known degenerative disc disease.  She has seen orthopedics and done physical therapy.  Suggest that she try an OTC lidocaine patch as needed She is not interested in narcotic medication at this time   Signed Lamar Blinks, MD  Received her labs- letter to pt   Results for orders placed or performed in visit on 01/18/19  CBC  Result Value Ref Range   WBC 10.3 4.0 - 10.5 K/uL   RBC 4.93 3.87 - 5.11 Mil/uL   Platelets 256.0 150.0 - 400.0 K/uL   Hemoglobin 13.8 12.0 - 15.0 g/dL   HCT 40.9 36.0 - 46.0 %   MCV 83.1 78.0 - 100.0 fl   MCHC 33.7 30.0 - 36.0 g/dL   RDW 13.2 11.5 - 15.5 %  Comprehensive metabolic panel  Result Value Ref Range   Sodium 141 135 - 145 mEq/L   Potassium 3.8 3.5 - 5.1 mEq/L   Chloride 104 96 - 112 mEq/L   CO2 29 19 - 32 mEq/L   Glucose, Bld 73 70 - 99 mg/dL   BUN 21 6 - 23 mg/dL   Creatinine, Ser 0.96 0.40 - 1.20 mg/dL   Total Bilirubin 0.3 0.2 - 1.2 mg/dL   Alkaline Phosphatase 83 39 - 117 U/L   AST 15 0 - 37 U/L   ALT 15 0 - 35 U/L   Total Protein 7.0 6.0 - 8.3 g/dL   Albumin 4.1 3.5 - 5.2 g/dL   Calcium 9.4 8.4 - 10.5 mg/dL   GFR 60.94 >60.00 mL/min  Hemoglobin A1c  Result Value Ref Range   Hgb A1c MFr Bld 5.6 4.6 - 6.5 %  Lipid panel  Result Value Ref Range   Cholesterol 204 (H) 0 - 200 mg/dL   Triglycerides 258.0 (H) 0.0 - 149.0 mg/dL   HDL 39.40 >39.00 mg/dL   VLDL 51.6 (H) 0.0 - 40.0 mg/dL   Total CHOL/HDL Ratio 5     NonHDL 164.77   LDL cholesterol, direct  Result Value Ref Range   Direct LDL 134.0 mg/dL

## 2019-01-18 ENCOUNTER — Ambulatory Visit (INDEPENDENT_AMBULATORY_CARE_PROVIDER_SITE_OTHER): Payer: Commercial Managed Care - PPO | Admitting: Family Medicine

## 2019-01-18 ENCOUNTER — Ambulatory Visit: Payer: 59 | Admitting: Family Medicine

## 2019-01-18 ENCOUNTER — Encounter: Payer: Self-pay | Admitting: Family Medicine

## 2019-01-18 VITALS — BP 148/90 | HR 83 | Temp 98.0°F | Resp 16 | Ht 64.0 in | Wt 189.0 lb

## 2019-01-18 DIAGNOSIS — Z1211 Encounter for screening for malignant neoplasm of colon: Secondary | ICD-10-CM

## 2019-01-18 DIAGNOSIS — Z13220 Encounter for screening for lipoid disorders: Secondary | ICD-10-CM | POA: Diagnosis not present

## 2019-01-18 DIAGNOSIS — Z131 Encounter for screening for diabetes mellitus: Secondary | ICD-10-CM | POA: Diagnosis not present

## 2019-01-18 DIAGNOSIS — I10 Essential (primary) hypertension: Secondary | ICD-10-CM | POA: Diagnosis not present

## 2019-01-18 DIAGNOSIS — Z23 Encounter for immunization: Secondary | ICD-10-CM | POA: Diagnosis not present

## 2019-01-18 DIAGNOSIS — Z130 Encounter for screening for diseases of the blood and blood-forming organs and certain disorders involving the immune mechanism: Secondary | ICD-10-CM

## 2019-01-18 LAB — CBC
HEMATOCRIT: 40.9 % (ref 36.0–46.0)
HEMOGLOBIN: 13.8 g/dL (ref 12.0–15.0)
MCHC: 33.7 g/dL (ref 30.0–36.0)
MCV: 83.1 fl (ref 78.0–100.0)
PLATELETS: 256 10*3/uL (ref 150.0–400.0)
RBC: 4.93 Mil/uL (ref 3.87–5.11)
RDW: 13.2 % (ref 11.5–15.5)
WBC: 10.3 10*3/uL (ref 4.0–10.5)

## 2019-01-18 LAB — COMPREHENSIVE METABOLIC PANEL
ALT: 15 U/L (ref 0–35)
AST: 15 U/L (ref 0–37)
Albumin: 4.1 g/dL (ref 3.5–5.2)
Alkaline Phosphatase: 83 U/L (ref 39–117)
BUN: 21 mg/dL (ref 6–23)
CALCIUM: 9.4 mg/dL (ref 8.4–10.5)
CO2: 29 meq/L (ref 19–32)
Chloride: 104 mEq/L (ref 96–112)
Creatinine, Ser: 0.96 mg/dL (ref 0.40–1.20)
GFR: 60.94 mL/min (ref >60.00)
GLUCOSE: 73 mg/dL (ref 70–99)
POTASSIUM: 3.8 meq/L (ref 3.5–5.1)
Sodium: 141 mEq/L (ref 135–145)
Total Bilirubin: 0.3 mg/dL (ref 0.2–1.2)
Total Protein: 7 g/dL (ref 6.0–8.3)

## 2019-01-18 LAB — LIPID PANEL
Cholesterol: 204 mg/dL — ABNORMAL HIGH (ref 0–200)
HDL: 39.4 mg/dL (ref >39.00)
NonHDL: 164.77
TRIGLYCERIDES: 258 mg/dL — AB (ref 0.0–149.0)
Total CHOL/HDL Ratio: 5
VLDL: 51.6 mg/dL — ABNORMAL HIGH (ref 0.0–40.0)

## 2019-01-18 LAB — LDL CHOLESTEROL, DIRECT: Direct LDL: 134 mg/dL

## 2019-01-18 LAB — HEMOGLOBIN A1C: HEMOGLOBIN A1C: 5.6 % (ref 4.6–6.5)

## 2019-01-18 MED ORDER — AMLODIPINE BESYLATE 10 MG PO TABS: 10.0000 mg | ORAL_TABLET | Freq: Every day | ORAL | 3 refills | Status: DC

## 2019-01-18 NOTE — Patient Instructions (Signed)
It was good to see you today, I will be in touch with your labs ASAP. If you do end up moving to [place], let me know and I can send your records to your new doctor You got your flu shot today, please think about getting the shingles vaccine at your convenience  Your blood pressure still a bit high, let us increase your amlodipine to 10 mg daily  I would recommend that we do colon cancer screening for you soon, we can do either Cologuard or have you see gastroenterology for colonoscopy.  Please just let me know how you like to proceed

## 2019-10-05 ENCOUNTER — Other Ambulatory Visit: Payer: Self-pay | Admitting: Family Medicine

## 2019-10-05 DIAGNOSIS — I10 Essential (primary) hypertension: Secondary | ICD-10-CM

## 2020-01-10 ENCOUNTER — Other Ambulatory Visit: Payer: Self-pay

## 2020-01-10 ENCOUNTER — Encounter: Payer: Self-pay | Admitting: Obstetrics & Gynecology

## 2020-01-10 ENCOUNTER — Ambulatory Visit (INDEPENDENT_AMBULATORY_CARE_PROVIDER_SITE_OTHER): Payer: Commercial Managed Care - PPO | Admitting: Obstetrics & Gynecology

## 2020-01-10 VITALS — BP 131/60 | HR 71 | Ht 64.0 in | Wt 231.1 lb

## 2020-01-10 DIAGNOSIS — Z1239 Encounter for other screening for malignant neoplasm of breast: Secondary | ICD-10-CM

## 2020-01-10 DIAGNOSIS — Z124 Encounter for screening for malignant neoplasm of cervix: Secondary | ICD-10-CM

## 2020-01-10 DIAGNOSIS — Z113 Encounter for screening for infections with a predominantly sexual mode of transmission: Secondary | ICD-10-CM | POA: Diagnosis not present

## 2020-01-10 DIAGNOSIS — Z30432 Encounter for removal of intrauterine contraceptive device: Secondary | ICD-10-CM | POA: Diagnosis not present

## 2020-01-10 DIAGNOSIS — Z01419 Encounter for gynecological examination (general) (routine) without abnormal findings: Secondary | ICD-10-CM

## 2020-01-10 NOTE — Progress Notes (Signed)
Subjective:     Kristen Ortega is a 54 y.o. female here for a routine exam.  Current complaints: Pt reports that she has had her IUD for >7 years. She does not want to have any more menses.     Gynecologic History No LMP recorded. (Menstrual status: IUD). Contraception: IUD Last Pap: 08/25/2017. Results were: normal Last mammogram: 09/02/2017. Results were: normal  Obstetric History OB History  Gravida Para Term Preterm AB Living  5 4 1 1 1     SAB TAB Ectopic Multiple Live Births      1 1 4     # Outcome Date GA Lbr Len/2nd Weight Sex Delivery Anes PTL Lv  5 Para           4 Preterm      CS-LTranv     3 Term      Vag-Spont     2 Para      Vag-Spont     1 Ectopic            The following portions of the patient's history were reviewed and updated as appropriate: allergies, current medications, past family history, past medical history, past social history, past surgical history and problem list.  Review of Systems Pertinent items are noted in HPI.    Objective:  BP 131/60   Pulse 71   Ht 5\' 4"  (1.626 m)   Wt 231 lb 1.3 oz (104.8 kg)   BMI 39.66 kg/m  General Appearance:    Alert, cooperative, no distress, appears stated age  Head:    Normocephalic, without obvious abnormality, atraumatic  Eyes:    conjunctiva/corneas clear, EOM's intact, both eyes  Ears:    Normal external ear canals, both ears  Nose:   Nares normal, septum midline, mucosa normal, no drainage    or sinus tenderness  Throat:   Lips, mucosa, and tongue normal; teeth and gums normal  Neck:   Supple, symmetrical, trachea midline, no adenopathy;    thyroid:  no enlargement/tenderness/nodules  Back:     Symmetric, no curvature, ROM normal, no CVA tenderness  Lungs:     respirations unlabored  Chest Wall:    No tenderness or deformity   Heart:    Regular rate and rhythm  Breast Exam:    No tenderness, masses, or nipple abnormality  Abdomen:     Soft, non-tender, bowel sounds active all four quadrants,    no  masses, no organomegaly  Genitalia:    Normal female without lesion, discharge or tenderness     Extremities:   Extremities normal, atraumatic, no cyanosis or edema  Pulses:   2+ and symmetric all extremities  Skin:   Skin color, texture, turgor normal, no rashes or lesions   Patient was in the dorsal lithotomy position, normal external genitalia was noted.  A speculum was placed in the patient's vagina, normal discharge was noted, no lesions. The multiparous cervix was visualized, no lesions, no abnormal discharge;  and the cervix was swabbed with Betadine using scopettes. The strings of the IUD were grasped and pulled using ring forceps.  The IUD was successfully removed in its entirety.  Patient tolerated the procedure well.       Assessment:  Healthy female exam IUD removal   Plan:   F/u in 1 year or sooner prn Mammogram ordered F/u PAP with hrHPV Pt to f/u if bleeding occurs.   Kristen Ortega, M.D., Cherlynn June

## 2020-01-10 NOTE — Patient Instructions (Signed)
Menopause Menopause is the normal time of life when menstrual periods stop completely. It is usually confirmed by 12 months without a menstrual period. The transition to menopause (perimenopause) most often happens between the ages of 45 and 55. During perimenopause, hormone levels change in your body, which can cause symptoms and affect your health. Menopause may increase your risk for:  Loss of bone (osteoporosis), which causes bone breaks (fractures).  Depression.  Hardening and narrowing of the arteries (atherosclerosis), which can cause heart attacks and strokes. What are the causes? This condition is usually caused by a natural change in hormone levels that happens as you get older. The condition may also be caused by surgery to remove both ovaries (bilateral oophorectomy). What increases the risk? This condition is more likely to start at an earlier age if you have certain medical conditions or treatments, including:  A tumor of the pituitary gland in the brain.  A disease that affects the ovaries and hormone production.  Radiation treatment for cancer.  Certain cancer treatments, such as chemotherapy or hormone (anti-estrogen) therapy.  Heavy smoking and excessive alcohol use.  Family history of early menopause. This condition is also more likely to develop earlier in women who are very thin. What are the signs or symptoms? Symptoms of this condition include:  Hot flashes.  Irregular menstrual periods.  Night sweats.  Changes in feelings about sex. This could be a decrease in sex drive or an increased comfort around your sexuality.  Vaginal dryness and thinning of the vaginal walls. This may cause painful intercourse.  Dryness of the skin and development of wrinkles.  Headaches.  Problems sleeping (insomnia).  Mood swings or irritability.  Memory problems.  Weight gain.  Hair growth on the face and chest.  Bladder infections or problems with urinating. How  is this diagnosed? This condition is diagnosed based on your medical history, a physical exam, your age, your menstrual history, and your symptoms. Hormone tests may also be done. How is this treated? In some cases, no treatment is needed. You and your health care provider should make a decision together about whether treatment is necessary. Treatment will be based on your individual condition and preferences. Treatment for this condition focuses on managing symptoms. Treatment may include:  Menopausal hormone therapy (MHT).  Medicines to treat specific symptoms or complications.  Acupuncture.  Vitamin or herbal supplements. Before starting treatment, make sure to let your health care provider know if you have a personal or family history of:  Heart disease.  Breast cancer.  Blood clots.  Diabetes.  Osteoporosis. Follow these instructions at home: Lifestyle  Do not use any products that contain nicotine or tobacco, such as cigarettes and e-cigarettes. If you need help quitting, ask your health care provider.  Get at least 30 minutes of physical activity on 5 or more days each week.  Avoid alcoholic and caffeinated beverages, as well as spicy foods. This may help prevent hot flashes.  Get 7-8 hours of sleep each night.  If you have hot flashes, try: ? Dressing in layers. ? Avoiding things that may trigger hot flashes, such as spicy food, warm places, or stress. ? Taking slow, deep breaths when a hot flash starts. ? Keeping a fan in your home and office.  Find ways to manage stress, such as deep breathing, meditation, or journaling.  Consider going to group therapy with other women who are having menopause symptoms. Ask your health care provider about recommended group therapy meetings. Eating and   drinking  Eat a healthy, balanced diet that contains whole grains, lean protein, low-fat dairy, and plenty of fruits and vegetables.  Your health care provider may recommend  adding more soy to your diet. Foods that contain soy include tofu, tempeh, and soy milk.  Eat plenty of foods that contain calcium and vitamin D for bone health. Items that are rich in calcium include low-fat milk, yogurt, beans, almonds, sardines, broccoli, and kale. Medicines  Take over-the-counter and prescription medicines only as told by your health care provider.  Talk with your health care provider before starting any herbal supplements. If prescribed, take vitamins and supplements as told by your health care provider. These may include: ? Calcium. Women age 51 and older should get 1,200 mg (milligrams) of calcium every day. ? Vitamin D. Women need 600-800 International Units of vitamin D each day. ? Vitamins B12 and B6. Aim for 50 micrograms of B12 and 1.5 mg of B6 each day. General instructions  Keep track of your menstrual periods, including: ? When they occur. ? How heavy they are and how long they last. ? How much time passes between periods.  Keep track of your symptoms, noting when they start, how often you have them, and how long they last.  Use vaginal lubricants or moisturizers to help with vaginal dryness and improve comfort during sex.  Keep all follow-up visits as told by your health care provider. This is important. This includes any group therapy or counseling. Contact a health care provider if:  You are still having menstrual periods after age 55.  You have pain during sex.  You have not had a period for 12 months and you develop vaginal bleeding. Get help right away if:  You have: ? Severe depression. ? Excessive vaginal bleeding. ? Pain when you urinate. ? A fast or irregular heart beat (palpitations). ? Severe headaches. ? Abdomen (abdominal) pain or severe indigestion.  You fell and you think you have a broken bone.  You develop leg or chest pain.  You develop vision problems.  You feel a lump in your breast. Summary  Menopause is the normal  time of life when menstrual periods stop completely. It is usually confirmed by 12 months without a menstrual period.  The transition to menopause (perimenopause) most often happens between the ages of 45 and 55.  Symptoms can be managed through medicines, lifestyle changes, and complementary therapies such as acupuncture.  Eat a balanced diet that is rich in nutrients to promote bone health and heart health and to manage symptoms during menopause. This information is not intended to replace advice given to you by your health care provider. Make sure you discuss any questions you have with your health care provider. Document Revised: 11/14/2017 Document Reviewed: 01/04/2017 Elsevier Patient Education  2020 Elsevier Inc.  

## 2020-01-12 LAB — CYTOLOGY - PAP
Comment: NEGATIVE
Diagnosis: NEGATIVE
High risk HPV: POSITIVE — AB

## 2020-01-29 DIAGNOSIS — C649 Malignant neoplasm of unspecified kidney, except renal pelvis: Secondary | ICD-10-CM | POA: Insufficient documentation

## 2020-01-29 NOTE — Progress Notes (Addendum)
[facility] [address], [place], [place] [address] [phone]  Date:  01/31/2020   Name:  Kristen Ortega   DOB:  Sep 14, 1966   MRN:  [id number]  PCP:  Darreld Mclean, MD    Chief Complaint: Annual Exam   History of Present Illness:  Kristen Ortega is a 54 y.o. very pleasant female patient who presents with the following:  Pt with history of HTN, left renal cell carcinoma s/p partial nephrectomy here today for a CPE Last seen by myself about a year ago She had her youngest child at age 66, gestational diabetes during that pregnancy  She saw her urologist, Dr. Felipa Eth at [facility], last month-history of kidney stones Her gynecologist is Dr. Ihor Dow  Flu vaccine; done in the fall Colon cancer screening- no family history.   She would like to see Gi and do a colonoscopy as per her insurance Cologuard may not be covered Mammogram- just done today Pap 2021- positive HR HPV per GYN, will need follow-up in 1 year Needs labs today- will do   She has history of significant degenerative disc disease-see plain films done 2018 She has used some tramadol in the past which did help some, she would like to have a prescription of possible She tends to have back pain after standing for a long time towards the end of the day She tends to favor her left leg and hip- she thinks this is due to her back hurting her and causing her to limp She does not get numbness or weakness in her leg  03/19/2018  1   03/18/2018  Tramadol Hcl 50 MG Tablet  40.00  12 Je Cop   [id number]   [place] ([id number])   0  16.67 MME  Comm Ins   [place]  01/31/2018  1   01/16/2018  Tramadol Hcl 50 MG Tablet  40.00  13 Je Cop   [id number]   [place] ([id number])   1  15.38 MME         Patient Active Problem List   Diagnosis Date Noted  . Renal cell carcinoma ([place]) 01/29/2020  . Benign essential HTN 09/17/2017  . Scoliosis of thoracolumbar spine 09/17/2017    Past  Medical History:  Diagnosis Date  . Vaginal Pap smear, abnormal     Past Surgical History:  Procedure Laterality Date  . CESAREAN SECTION    . COLPOSCOPY      Social History   Tobacco Use  . Smoking status: Former Smoker    Years: 20.00  . Smokeless tobacco: Never Used  Substance Use Topics  . Alcohol use: Yes    Comment: occasional  . Drug use: No    Family History  Problem Relation Age of Onset  . Stroke Paternal Grandmother   . Hypertension Father   . Cancer Mother        lymphoma  . Diabetes Neg Hx     No Known Allergies  Medication list has been reviewed and updated.  Current Outpatient Medications on File Prior to Visit  Medication Sig Dispense Refill  . amLODipine (NORVASC) 10 MG tablet TAKE 1 TABLET BY MOUTH EVERY DAY 90 tablet 1  . calcium carbonate (TUMS - DOSED IN MG ELEMENTAL CALCIUM) 500 MG chewable tablet Chew 1 tablet by mouth daily.    . diphenhydrAMINE (BENADRYL) 50 MG tablet Take 50 mg by mouth at bedtime as needed for itching.    Marland Kitchen  ibuprofen (ADVIL,MOTRIN) 200 MG tablet Take 200 mg by mouth every 6 (six) hours as needed.    . lovastatin (MEVACOR) 20 MG tablet Take 1 tablet (20 mg total) by mouth at bedtime. 90 tablet 3  . nystatin-triamcinolone (MYCOLOG II) cream Apply 1 application topically 2 (two) times daily.    . Omega-3 Fatty Acids (FISH OIL) 500 MG CAPS Take by mouth.     No current facility-administered medications on file prior to visit.    Review of Systems:  As per HPI- otherwise negative.   Physical Examination: Vitals:   01/31/20 1304  BP: 124/80  Pulse: 74  Resp: 16  Temp: 97.8 F (36.6 C)  SpO2: 98%   Vitals:   01/31/20 1304  Weight: 226 lb (102.5 kg)  Height: 5\' 4"  (1.626 m)   Body mass index is 38.79 kg/m. Ideal Body Weight: Weight in (lb) to have BMI = 25: 145.3  GEN: no acute distress.  Central obesity, looks well.  Her normal self HEENT: Atraumatic, Normocephalic.   TM within normal limits  bilaterally Ears and Nose: No external deformity. CV: RRR, No M/G/R. No JVD. No thrill. No extra heart sounds. PULM: CTA B, no wheezes, crackles, rhonchi. No retractions. No resp. distress. No accessory muscle use.  Small umbilical hernia, abdomen is benign ABD: S, NT, ND, +BS. No rebound. No HSM. EXTR: No c/c/e PSYCH: Normally interactive. Conversant.  She has a few discrete skin lesions around both ankles, right more than left.  They are nontender.  Appear possibly consistent with some sort of bite-however patient states they have been present since the summer 2020 Left hip exam is negative, normal range of motion without pain   Assessment and Plan: Physical exam  Screening for diabetes mellitus - Plan: Comprehensive metabolic panel, Hemoglobin A1c  Screening for hyperlipidemia - Plan: Lipid panel  Screening for deficiency anemia - Plan: CBC  Benign essential HTN - Plan: amLODipine (NORVASC) 10 MG tablet  Screening for colon cancer - Plan: Ambulatory referral to Gastroenterology  Screening for thyroid disorder - Plan: TSH  Renal cell carcinoma of left kidney (HCC)  Left hip pain - Plan: traMADol (ULTRAM) 50 MG tablet  Rash and nonspecific skin eruption - Plan: nystatin-triamcinolone (MYCOLOG II) cream  Scoliosis of thoracolumbar spine, unspecified scoliosis type - Plan: traMADol (ULTRAM) 50 MG tablet  Here today for a physical exam and follow-up Blood pressure under good control, continue amlodipine She has stopped taking her statin, will possibly willing to go back on it pending on her lipid panel. Referral to gastroenterology for colon cancer screening Tramadol Rx to use as needed for back pain Will plan further follow- up pending labs. Refilled her antifungal/steroid cream, she will let me know if marks in her ankles do not resolve  Will plan further follow- up pending labs.  This visit occurred during the SARS-CoV-2 public health emergency.  Safety protocols were in  place, including screening questions prior to the visit, additional usage of staff PPE, and extensive cleaning of exam room while observing appropriate contact time as indicated for disinfecting solutions.    Signed Lamar Blinks, MD  Received her labs, message to patient  Results for orders placed or performed in visit on 01/31/20  CBC  Result Value Ref Range   WBC 9.7 4.0 - 10.5 K/uL   RBC 5.12 (H) 3.87 - 5.11 Mil/uL   Platelets 218.0 150.0 - 400.0 K/uL   Hemoglobin 14.2 12.0 - 15.0 g/dL   HCT 42.4 36.0 - 46.0 %  MCV 82.9 78.0 - 100.0 fl   MCHC 33.4 30.0 - 36.0 g/dL   RDW 13.4 11.5 - 15.5 %  Comprehensive metabolic panel  Result Value Ref Range   Sodium 141 135 - 145 mEq/L   Potassium 3.3 (L) 3.5 - 5.1 mEq/L   Chloride 104 96 - 112 mEq/L   CO2 30 19 - 32 mEq/L   Glucose, Bld 92 70 - 99 mg/dL   BUN 19 6 - 23 mg/dL   Creatinine, Ser 0.80 0.40 - 1.20 mg/dL   Total Bilirubin 0.4 0.2 - 1.2 mg/dL   Alkaline Phosphatase 95 39 - 117 U/L   AST 15 0 - 37 U/L   ALT 12 0 - 35 U/L   Total Protein 7.1 6.0 - 8.3 g/dL   Albumin 4.1 3.5 - 5.2 g/dL   GFR 74.91 >60.00 mL/min   Calcium 9.6 8.4 - 10.5 mg/dL  Hemoglobin A1c  Result Value Ref Range   Hgb A1c MFr Bld 5.7 4.6 - 6.5 %  Lipid panel  Result Value Ref Range   Cholesterol 208 (H) 0 - 200 mg/dL   Triglycerides 216.0 (H) 0.0 - 149.0 mg/dL   HDL 40.10 >39.00 mg/dL   VLDL 43.2 (H) 0.0 - 40.0 mg/dL   Total CHOL/HDL Ratio 5    NonHDL 167.70   TSH  Result Value Ref Range   TSH 0.76 0.35 - 4.50 uIU/mL  LDL cholesterol, direct  Result Value Ref Range   Direct LDL 137.0 mg/dL   Taking lovastatin 20 No diuretics

## 2020-01-30 NOTE — Patient Instructions (Addendum)
It was nice to see you again today, I will be in touch with your labs as soon as possible We can try tramadol again for your back pain- use sparingly as needed, ok to combine with ibuprofen/ aleve and/ or tylenol as needed  We will refer you to GI for a colonoscopy BP looks good- refilled medication Cream refilled for your ash- let me know if not resolved   Please consider getting the Shingrix vaccine at some point- you might want to check on coverage with your insurnace first    Health Maintenance, Female Adopting a healthy lifestyle and getting preventive care are important in promoting health and wellness. Ask your health care provider about:  The right schedule for you to have regular tests and exams.  Things you can do on your own to prevent diseases and keep yourself healthy. What should I know about diet, weight, and exercise? Eat a healthy diet   Eat a diet that includes plenty of vegetables, fruits, low-fat dairy products, and lean protein.  Do not eat a lot of foods that are high in solid fats, added sugars, or sodium. Maintain a healthy weight Body mass index (BMI) is used to identify weight problems. It estimates body fat based on height and weight. Your health care provider can help determine your BMI and help you achieve or maintain a healthy weight. Get regular exercise Get regular exercise. This is one of the most important things you can do for your health. Most adults should:  Exercise for at least 150 minutes each week. The exercise should increase your heart rate and make you sweat (moderate-intensity exercise).  Do strengthening exercises at least twice a week. This is in addition to the moderate-intensity exercise.  Spend less time sitting. Even light physical activity can be beneficial. Watch cholesterol and blood lipids Have your blood tested for lipids and cholesterol at 54 years of age, then have this test every 5 years. Have your cholesterol levels  checked more often if:  Your lipid or cholesterol levels are high.  You are older than 54 years of age.  You are at high risk for heart disease. What should I know about cancer screening? Depending on your health history and family history, you may need to have cancer screening at various ages. This may include screening for:  Breast cancer.  Cervical cancer.  Colorectal cancer.  Skin cancer.  Lung cancer. What should I know about heart disease, diabetes, and high blood pressure? Blood pressure and heart disease  High blood pressure causes heart disease and increases the risk of stroke. This is more likely to develop in people who have high blood pressure readings, are of African descent, or are overweight.  Have your blood pressure checked: ? Every 3-5 years if you are 45-67 years of age. ? Every year if you are 3 years old or older. Diabetes Have regular diabetes screenings. This checks your fasting blood sugar level. Have the screening done:  Once every three years after age 52 if you are at a normal weight and have a low risk for diabetes.  More often and at a younger age if you are overweight or have a high risk for diabetes. What should I know about preventing infection? Hepatitis B If you have a higher risk for hepatitis B, you should be screened for this virus. Talk with your health care provider to find out if you are at risk for hepatitis B infection. Hepatitis C Testing is recommended for:  Everyone born from [birth year] through 1965.  Anyone with known risk factors for hepatitis C. Sexually transmitted infections (STIs)  Get screened for STIs, including gonorrhea and chlamydia, if: ? You are sexually active and are younger than 54 years of age. ? You are older than 54 years of age and your health care provider tells you that you are at risk for this type of infection. ? Your sexual activity has changed since you were last screened, and you are at increased risk  for chlamydia or gonorrhea. Ask your health care provider if you are at risk.  Ask your health care provider about whether you are at high risk for HIV. Your health care provider may recommend a prescription medicine to help prevent HIV infection. If you choose to take medicine to prevent HIV, you should first get tested for HIV. You should then be tested every 3 months for as long as you are taking the medicine. Pregnancy  If you are about to stop having your period (premenopausal) and you may become pregnant, seek counseling before you get pregnant.  Take 400 to 800 micrograms (mcg) of folic acid every day if you become pregnant.  Ask for birth control (contraception) if you want to prevent pregnancy. Osteoporosis and menopause Osteoporosis is a disease in which the bones lose minerals and strength with aging. This can result in bone fractures. If you are 23 years old or older, or if you are at risk for osteoporosis and fractures, ask your health care provider if you should:  Be screened for bone loss.  Take a calcium or vitamin D supplement to lower your risk of fractures.  Be given hormone replacement therapy (HRT) to treat symptoms of menopause. Follow these instructions at home: Lifestyle  Do not use any products that contain nicotine or tobacco, such as cigarettes, e-cigarettes, and chewing tobacco. If you need help quitting, ask your health care provider.  Do not use street drugs.  Do not share needles.  Ask your health care provider for help if you need support or information about quitting drugs. Alcohol use  Do not drink alcohol if: ? Your health care provider tells you not to drink. ? You are pregnant, may be pregnant, or are planning to become pregnant.  If you drink alcohol: ? Limit how much you use to 0-1 drink a day. ? Limit intake if you are breastfeeding.  Be aware of how much alcohol is in your drink. In the [place] one drink equals one 12 oz bottle of beer (355  mL), one 5 oz glass of wine (148 mL), or one 1 oz glass of hard liquor (44 mL). General instructions  Schedule regular health, dental, and eye exams.  Stay current with your vaccines.  Tell your health care provider if: ? You often feel depressed. ? You have ever been abused or do not feel safe at home. Summary  Adopting a healthy lifestyle and getting preventive care are important in promoting health and wellness.  Follow your health care provider's instructions about healthy diet, exercising, and getting tested or screened for diseases.  Follow your health care provider's instructions on monitoring your cholesterol and blood pressure. This information is not intended to replace advice given to you by your health care provider. Make sure you discuss any questions you have with your health care provider. Document Revised: 11/25/2018 Document Reviewed: 11/25/2018 Elsevier Patient Education  [place]. Obese, looks well

## 2020-01-31 ENCOUNTER — Encounter: Payer: Self-pay | Admitting: Family Medicine

## 2020-01-31 ENCOUNTER — Other Ambulatory Visit: Payer: Self-pay

## 2020-01-31 ENCOUNTER — Ambulatory Visit (INDEPENDENT_AMBULATORY_CARE_PROVIDER_SITE_OTHER): Payer: Commercial Managed Care - PPO | Admitting: Family Medicine

## 2020-01-31 ENCOUNTER — Ambulatory Visit (HOSPITAL_BASED_OUTPATIENT_CLINIC_OR_DEPARTMENT_OTHER)
Admission: RE | Admit: 2020-01-31 | Discharge: 2020-01-31 | Disposition: A | Discharge status: Home / Self Care | Payer: Commercial Managed Care - PPO | Source: Ambulatory Visit | Attending: Obstetrics & Gynecology | Admitting: Obstetrics & Gynecology

## 2020-01-31 VITALS — BP 124/80 | HR 74 | Temp 97.8°F | Resp 16 | Ht 64.0 in | Wt 226.0 lb

## 2020-01-31 DIAGNOSIS — Z1239 Encounter for other screening for malignant neoplasm of breast: Secondary | ICD-10-CM

## 2020-01-31 DIAGNOSIS — Z1231 Encounter for screening mammogram for malignant neoplasm of breast: Secondary | ICD-10-CM | POA: Insufficient documentation

## 2020-01-31 DIAGNOSIS — I10 Essential (primary) hypertension: Secondary | ICD-10-CM

## 2020-01-31 DIAGNOSIS — C642 Malignant neoplasm of left kidney, except renal pelvis: Secondary | ICD-10-CM

## 2020-01-31 DIAGNOSIS — M419 Scoliosis, unspecified: Secondary | ICD-10-CM

## 2020-01-31 DIAGNOSIS — Z131 Encounter for screening for diabetes mellitus: Secondary | ICD-10-CM

## 2020-01-31 DIAGNOSIS — Z0000 Encounter for general adult medical examination without abnormal findings: Secondary | ICD-10-CM | POA: Diagnosis not present

## 2020-01-31 DIAGNOSIS — R21 Rash and other nonspecific skin eruption: Secondary | ICD-10-CM

## 2020-01-31 DIAGNOSIS — Z130 Encounter for screening for diseases of the blood and blood-forming organs and certain disorders involving the immune mechanism: Secondary | ICD-10-CM | POA: Diagnosis not present

## 2020-01-31 DIAGNOSIS — E785 Hyperlipidemia, unspecified: Secondary | ICD-10-CM

## 2020-01-31 DIAGNOSIS — R7303 Prediabetes: Secondary | ICD-10-CM | POA: Insufficient documentation

## 2020-01-31 DIAGNOSIS — Z1329 Encounter for screening for other suspected endocrine disorder: Secondary | ICD-10-CM

## 2020-01-31 DIAGNOSIS — Z13220 Encounter for screening for lipoid disorders: Secondary | ICD-10-CM | POA: Diagnosis not present

## 2020-01-31 DIAGNOSIS — M25552 Pain in left hip: Secondary | ICD-10-CM

## 2020-01-31 DIAGNOSIS — Z1211 Encounter for screening for malignant neoplasm of colon: Secondary | ICD-10-CM

## 2020-01-31 LAB — CBC
HCT: 42.4 % (ref 36.0–46.0)
Hemoglobin: 14.2 g/dL (ref 12.0–15.0)
MCHC: 33.4 g/dL (ref 30.0–36.0)
MCV: 82.9 fl (ref 78.0–100.0)
Platelets: 218 10*3/uL (ref 150.0–400.0)
RBC: 5.12 Mil/uL — ABNORMAL HIGH (ref 3.87–5.11)
RDW: 13.4 % (ref 11.5–15.5)
WBC: 9.7 10*3/uL (ref 4.0–10.5)

## 2020-01-31 LAB — COMPREHENSIVE METABOLIC PANEL
ALT: 12 U/L (ref 0–35)
AST: 15 U/L (ref 0–37)
Albumin: 4.1 g/dL (ref 3.5–5.2)
Alkaline Phosphatase: 95 U/L (ref 39–117)
BUN: 19 mg/dL (ref 6–23)
CO2: 30 mEq/L (ref 19–32)
Calcium: 9.6 mg/dL (ref 8.4–10.5)
Chloride: 104 mEq/L (ref 96–112)
Creatinine, Ser: 0.8 mg/dL (ref 0.40–1.20)
GFR: 74.91 mL/min (ref >60.00)
Glucose, Bld: 92 mg/dL (ref 70–99)
Potassium: 3.3 mEq/L — ABNORMAL LOW (ref 3.5–5.1)
Sodium: 141 mEq/L (ref 135–145)
Total Bilirubin: 0.4 mg/dL (ref 0.2–1.2)
Total Protein: 7.1 g/dL (ref 6.0–8.3)

## 2020-01-31 LAB — HEMOGLOBIN A1C: Hgb A1c MFr Bld: 5.7 % (ref 4.6–6.5)

## 2020-01-31 LAB — LIPID PANEL
Cholesterol: 208 mg/dL — ABNORMAL HIGH (ref 0–200)
HDL: 40.1 mg/dL (ref >39.00)
NonHDL: 167.7
Total CHOL/HDL Ratio: 5
Triglycerides: 216 mg/dL — ABNORMAL HIGH (ref 0.0–149.0)
VLDL: 43.2 mg/dL — ABNORMAL HIGH (ref 0.0–40.0)

## 2020-01-31 LAB — LDL CHOLESTEROL, DIRECT: Direct LDL: 137 mg/dL

## 2020-01-31 LAB — TSH: TSH: 0.76 u[IU]/mL (ref 0.35–4.50)

## 2020-01-31 MED ORDER — TRAMADOL HCL 50 MG PO TABS: 50.0000 mg | ORAL_TABLET | Freq: Two times a day (BID) | ORAL | 0 refills | Status: DC | PRN

## 2020-01-31 MED ORDER — NYSTATIN-TRIAMCINOLONE 100000-0.1 UNIT/GM-% EX CREA: 1.0000 "application " | TOPICAL_CREAM | Freq: Two times a day (BID) | CUTANEOUS | 3 refills | Status: DC

## 2020-01-31 MED ORDER — AMLODIPINE BESYLATE 10 MG PO TABS: 10.0000 mg | ORAL_TABLET | Freq: Every day | ORAL | 3 refills | Status: DC

## 2020-02-01 MED ORDER — ROSUVASTATIN CALCIUM 10 MG PO TABS: 10.0000 mg | ORAL_TABLET | Freq: Every day | ORAL | 3 refills | Status: DC

## 2020-02-04 ENCOUNTER — Other Ambulatory Visit: Payer: Self-pay

## 2020-02-04 ENCOUNTER — Ambulatory Visit (AMBULATORY_SURGERY_CENTER): Payer: Commercial Managed Care - PPO | Admitting: *Deleted

## 2020-02-04 VITALS — Temp 97.1°F | Ht 64.0 in | Wt 225.0 lb

## 2020-02-04 DIAGNOSIS — Z1211 Encounter for screening for malignant neoplasm of colon: Secondary | ICD-10-CM

## 2020-02-04 DIAGNOSIS — Z1159 Encounter for screening for other viral diseases: Secondary | ICD-10-CM

## 2020-02-04 MED ORDER — SUPREP BOWEL PREP KIT 17.5-3.13-1.6 GM/177ML PO SOLN: 1.0000 | Freq: Once | ORAL | 0 refills | Status: AC

## 2020-02-04 NOTE — Progress Notes (Signed)
No egg or soy allergy known to patient  No issues with past sedation with any surgeries  or procedures, no intubation problems  No diet pills per patient No home 02 use per patient  No blood thinners per patient  Pt denies issues with constipation  No A fib or A flutter  EMMI video sent to pt's e mail  $15 coupon for suprep and suprep code sent to pharmacy with suprep order   Due to the COVID-19 pandemic we are asking patients to follow these guidelines. Please only bring one care partner. Please be aware that your care partner may wait in the car in the parking lot or if they feel like they will be too hot to wait in the car, they may wait in the lobby on the 4th floor. All care partners are required to wear a mask the entire time (we do not have any that we can provide them), they need to practice social distancing, and we will do a Covid check for all patient's and care partners when you arrive. Also we will check their temperature and your temperature. If the care partner waits in their car they need to stay in the parking lot the entire time and we will call them on their cell phone when the patient is ready for discharge so they can bring the car to the front of the building. Also all patient's will need to wear a mask into building.

## 2020-02-09 ENCOUNTER — Ambulatory Visit (INDEPENDENT_AMBULATORY_CARE_PROVIDER_SITE_OTHER): Payer: Commercial Managed Care - PPO

## 2020-02-09 ENCOUNTER — Other Ambulatory Visit: Payer: Self-pay

## 2020-02-09 DIAGNOSIS — Z1159 Encounter for screening for other viral diseases: Secondary | ICD-10-CM

## 2020-02-10 ENCOUNTER — Other Ambulatory Visit: Payer: Self-pay | Admitting: Family Medicine

## 2020-02-10 DIAGNOSIS — M419 Scoliosis, unspecified: Secondary | ICD-10-CM

## 2020-02-10 DIAGNOSIS — M25552 Pain in left hip: Secondary | ICD-10-CM

## 2020-02-10 LAB — SARS CORONAVIRUS 2 (TAT 6-24 HRS): SARS Coronavirus 2: NEGATIVE

## 2020-02-11 ENCOUNTER — Encounter: Payer: Self-pay | Admitting: Gastroenterology

## 2020-02-11 NOTE — Telephone Encounter (Signed)
Requesting:Tramadol Contract:none [id number] Last Visit: 02/10/2020 Next Visit: 01/31/2021 Last Refill:01/31/2020  Please Advise

## 2020-02-14 ENCOUNTER — Ambulatory Visit (AMBULATORY_SURGERY_CENTER): Payer: 59 | Admitting: Gastroenterology

## 2020-02-14 ENCOUNTER — Other Ambulatory Visit: Payer: Self-pay

## 2020-02-14 ENCOUNTER — Encounter: Payer: Self-pay | Admitting: Gastroenterology

## 2020-02-14 VITALS — BP 125/58 | HR 68 | Temp 95.7°F | Resp 20 | Ht 64.0 in | Wt 225.0 lb

## 2020-02-14 DIAGNOSIS — D122 Benign neoplasm of ascending colon: Secondary | ICD-10-CM

## 2020-02-14 DIAGNOSIS — D125 Benign neoplasm of sigmoid colon: Secondary | ICD-10-CM

## 2020-02-14 DIAGNOSIS — D128 Benign neoplasm of rectum: Secondary | ICD-10-CM

## 2020-02-14 DIAGNOSIS — K5140 Inflammatory polyps of colon without complications: Secondary | ICD-10-CM

## 2020-02-14 DIAGNOSIS — K635 Polyp of colon: Secondary | ICD-10-CM | POA: Diagnosis not present

## 2020-02-14 DIAGNOSIS — K621 Rectal polyp: Secondary | ICD-10-CM | POA: Diagnosis not present

## 2020-02-14 DIAGNOSIS — D123 Benign neoplasm of transverse colon: Secondary | ICD-10-CM

## 2020-02-14 DIAGNOSIS — Z1211 Encounter for screening for malignant neoplasm of colon: Secondary | ICD-10-CM | POA: Diagnosis present

## 2020-02-14 DIAGNOSIS — D127 Benign neoplasm of rectosigmoid junction: Secondary | ICD-10-CM | POA: Diagnosis not present

## 2020-02-14 HISTORY — PX: COLONOSCOPY: SHX174

## 2020-02-14 MED ORDER — SODIUM CHLORIDE 0.9 % IV SOLN: 500.0000 mL | Freq: Once | INTRAVENOUS | Status: DC

## 2020-02-14 NOTE — Progress Notes (Signed)
Called to room to assist during endoscopic procedure.  Patient ID and intended procedure confirmed with present staff. Received instructions for my participation in the procedure from the performing physician.  

## 2020-02-14 NOTE — Patient Instructions (Signed)
Impression/Recommendations:  Polyp handout given to patient. Diverticulosis handout given to patient. Hemorrhoid handout given to patient. High fiber diet handout given to patient.  Continue present medications. Await pathology results.  Repeat colonoscopy.  Date to be determined after pathology results reviewed.  YOU HAD AN ENDOSCOPIC PROCEDURE TODAY AT [facility]:   Refer to the procedure report that was given to you for any specific questions about what was found during the examination.  If the procedure report does not answer your questions, please call your gastroenterologist to clarify.  If you requested that your care partner not be given the details of your procedure findings, then the procedure report has been included in a sealed envelope for you to review at your convenience later.  YOU SHOULD EXPECT: Some feelings of bloating in the abdomen. Passage of more gas than usual.  Walking can help get rid of the air that was put into your GI tract during the procedure and reduce the bloating. If you had a lower endoscopy (such as a colonoscopy or flexible sigmoidoscopy) you may notice spotting of blood in your stool or on the toilet paper. If you underwent a bowel prep for your procedure, you may not have a normal bowel movement for a few days.  Please Note:  You might notice some irritation and congestion in your nose or some drainage.  This is from the oxygen used during your procedure.  There is no need for concern and it should clear up in a day or so.  SYMPTOMS TO REPORT IMMEDIATELY:   Following lower endoscopy (colonoscopy or flexible sigmoidoscopy):  Excessive amounts of blood in the stool  Significant tenderness or worsening of abdominal pains  Swelling of the abdomen that is new, acute  Fever of 100F or higher For urgent or emergent issues, a gastroenterologist can be reached at any hour by calling [phone].   DIET:  We do recommend a small meal at  first, but then you may proceed to your regular diet.  Drink plenty of fluids but you should avoid alcoholic beverages for 24 hours.  ACTIVITY:  You should plan to take it easy for the rest of today and you should NOT DRIVE or use heavy machinery until tomorrow (because of the sedation medicines used during the test).    FOLLOW UP: Our staff will call the number listed on your records 48-72 hours following your procedure to check on you and address any questions or concerns that you may have regarding the information given to you following your procedure. If we do not reach you, we will leave a message.  We will attempt to reach you two times.  During this call, we will ask if you have developed any symptoms of COVID 19. If you develop any symptoms (ie: fever, flu-like symptoms, shortness of breath, cough etc.) before then, please call [phone].  If you test positive for Covid 19 in the 2 weeks post procedure, please call and report this information to [place].    If any biopsies were taken you will be contacted by phone or by letter within the next 1-3 weeks.  Please call us at [phone] if you have not heard about the biopsies in 3 weeks.    SIGNATURES/CONFIDENTIALITY: You and/or your care partner have signed paperwork which will be entered into your electronic medical record.  These signatures attest to the fact that that the information above on your After Visit Summary has been reviewed and is understood.  Full responsibility of the confidentiality of this discharge information lies with you and/or your care-partner.

## 2020-02-14 NOTE — Op Note (Signed)
[facility] Patient Name: Kristen Ortega Procedure Date: 02/14/2020 1:50 PM MRN: [id number] Endoscopist: Thornton Park MD, MD Age: 54 Referring MD:  Date of Birth: [birth year] Gender: Female Account #: [id number] Procedure:                Colonoscopy Indications:              Screening for colorectal malignant neoplasm, This                            is the patient's first colonoscopy                           No known family history of colon cancer or polyps Medicines:                Monitored Anesthesia Care Procedure:                Pre-Anesthesia Assessment:                           - Prior to the procedure, a History and Physical                            was performed, and patient medications and                            allergies were reviewed. The patient's tolerance of                            previous anesthesia was also reviewed. The risks                            and benefits of the procedure and the sedation                            options and risks were discussed with the patient.                            All questions were answered, and informed consent                            was obtained. Prior Anticoagulants: The patient has                            taken no previous anticoagulant or antiplatelet                            agents. ASA Grade Assessment: II - A patient with                            mild systemic disease. After reviewing the risks                            and benefits, the patient was deemed in  satisfactory condition to undergo the procedure.                           After obtaining informed consent, the colonoscope                            was passed under direct vision. Throughout the                            procedure, the patient's blood pressure, pulse, and                            oxygen saturations were monitored continuously. The                            Colonoscope was  introduced through the anus and                            advanced to the the cecum, identified by                            appendiceal orifice and ileocecal valve. A second                            forward view of the right colon was performed. The                            colonoscopy was performed with moderate difficulty                            due to a redundant colon, significant looping and a                            tortuous colon. Successful completion of the                            procedure was aided by applying abdominal pressure.                            The patient tolerated the procedure well. The                            quality of the bowel preparation was good. The                            ileocecal valve, appendiceal orifice, and rectum                            were photographed. Scope In: 2:06:11 PM Scope Out: 2:29:25 PM Scope Withdrawal Time: 0 hours 16 minutes 31 seconds  Total Procedure Duration: 0 hours 23 minutes 14 seconds  Findings:                 The perianal and digital rectal examinations were  normal.                           Non-bleeding internal hemorrhoids were found. The                            hemorrhoids were small.                           Multiple small and large-mouthed diverticula were                            found in the entire colon.                           A 1 mm polyp was found in the ascending colon. The                            polyp was sessile. The polyp was removed with a                            cold snare. Resection and retrieval were complete.                            Estimated blood loss was minimal.                           A 2 mm polyp was found in the transverse colon. The                            polyp was sessile. The polyp was removed with a                            cold snare. Resection and retrieval were complete.                            Estimated blood  loss was minimal.                           A 3 mm polyp was found in the mid sigmoid colon.                            The polyp was sessile. The polyp was removed with a                            cold snare. Resection and retrieval were complete.                            Estimated blood loss was minimal.                           A 1 mm polyp was found in the rectum. The polyp was  sessile. The polyp was removed with a cold snare.                            Resection and retrieval were complete. Estimated                            blood loss was minimal.                           Multiple, small, hyperplastic polyps were present                            in the return. The exam was otherwise without                            abnormality on direct and retroflexion views. Complications:            No immediate complications. Estimated blood loss:                            Minimal. Estimated Blood Loss:     Estimated blood loss was minimal. Impression:               - Non-bleeding internal hemorrhoids.                           - Diverticulosis in the entire examined colon.                           - One 1 mm polyp in the ascending colon, removed                            with a cold snare. Resected and retrieved.                           - One 2 mm polyp in the transverse colon, removed                            with a cold snare. Resected and retrieved.                           - One 3 mm polyp in the mid sigmoid colon, removed                            with a cold snare. Resected and retrieved.                           - One 1 mm polyp in the rectum, removed with a cold                            snare. Resected and retrieved.                           - The examination was otherwise normal on direct  and retroflexion views. Recommendation:           - Patient has a contact number available for                             emergencies. The signs and symptoms of potential                            delayed complications were discussed with the                            patient. Return to normal activities tomorrow.                            Written discharge instructions were provided to the                            patient.                           - High fiber diet. Drink at least 64 ounces of                            water every day. Consider adding a stool bulking                            agent such as Metamucil.                           - Continue present medications.                           - Await pathology results.                           - Repeat colonoscopy date to be determined after                            pending pathology results are reviewed for                            surveillance. Thornton Park MD, MD 02/14/2020 2:39:43 PM This report has been signed electronically.

## 2020-02-14 NOTE — Progress Notes (Signed)
pt tolerated well. VSS. awake and to recovery. Report given to RN.  

## 2020-02-14 NOTE — Progress Notes (Signed)
Pt's states no medical or surgical changes since previsit or office visit.  Temp JB Vitals KA

## 2020-02-16 ENCOUNTER — Telehealth: Payer: Self-pay

## 2020-02-16 ENCOUNTER — Telehealth: Payer: Self-pay | Admitting: *Deleted

## 2020-02-16 NOTE — Telephone Encounter (Signed)
  Follow up Call-  Call back number 02/14/2020  Post procedure Call Back phone  # [phone]  Permission to leave phone message Yes  Some recent data might be hidden     Patient questions:  Do you have a fever, pain , or abdominal swelling? No. Pain Score  0 *  Have you tolerated food without any problems? Yes.    Have you been able to return to your normal activities? Yes.    Do you have any questions about your discharge instructions: Diet   No. Medications  No. Follow up visit  No.  Do you have questions or concerns about your Care? No.  Actions: * If pain score is 4 or above: No action needed, pain <4.  1. Have you developed a fever since your procedure? no  2.   Have you had an respiratory symptoms (SOB or cough) since your procedure? no  3.   Have you tested positive for COVID 19 since your procedure no  4.   Have you had any family members/close contacts diagnosed with the COVID 19 since your procedure?  no   If yes to any of these questions please route to Joylene John, RN and Alphonsa Gin, Therapist, sports.

## 2020-02-16 NOTE — Telephone Encounter (Signed)
No answer, left message to call back later today, B.Leandrew Keech RN. 

## 2020-02-18 ENCOUNTER — Encounter: Payer: Self-pay | Admitting: Gastroenterology

## 2020-02-27 ENCOUNTER — Other Ambulatory Visit: Payer: Self-pay | Admitting: Family Medicine

## 2020-02-27 DIAGNOSIS — M419 Scoliosis, unspecified: Secondary | ICD-10-CM

## 2020-02-27 DIAGNOSIS — M25552 Pain in left hip: Secondary | ICD-10-CM

## 2020-02-28 NOTE — Telephone Encounter (Signed)
Requesting:tramadol Contract:none [id number] Last Visit:01/31/2020 Next Visit:01/31/2021 Last Refill:02/11/2020  Please Advise

## 2020-03-11 ENCOUNTER — Other Ambulatory Visit: Payer: Self-pay | Admitting: Family Medicine

## 2020-03-11 DIAGNOSIS — M419 Scoliosis, unspecified: Secondary | ICD-10-CM

## 2020-03-11 DIAGNOSIS — M25552 Pain in left hip: Secondary | ICD-10-CM

## 2020-03-13 NOTE — Telephone Encounter (Signed)
Requesting:tramadol  Contract:no  UDS: n/a Last OV:01/31/20 Next OV:01/31/21 Last Refill:02/28/20  #[id number] Database:   Please advise

## 2020-03-24 ENCOUNTER — Other Ambulatory Visit: Payer: Self-pay | Admitting: Family Medicine

## 2020-03-24 ENCOUNTER — Encounter: Payer: Self-pay | Admitting: Family Medicine

## 2020-03-24 DIAGNOSIS — M419 Scoliosis, unspecified: Secondary | ICD-10-CM

## 2020-03-24 DIAGNOSIS — G8929 Other chronic pain: Secondary | ICD-10-CM

## 2020-03-24 DIAGNOSIS — M25552 Pain in left hip: Secondary | ICD-10-CM

## 2020-03-24 NOTE — Telephone Encounter (Signed)
Requesting:tramadol Contract:n/a UDS:n/a Last OV:01/31/20 Next OV:01/31/21 Last Refill:03/13/20  #[id number] Database:   Please advise

## 2020-03-28 DIAGNOSIS — G8929 Other chronic pain: Secondary | ICD-10-CM | POA: Insufficient documentation

## 2020-03-28 DIAGNOSIS — M549 Dorsalgia, unspecified: Secondary | ICD-10-CM | POA: Insufficient documentation

## 2020-04-11 ENCOUNTER — Other Ambulatory Visit: Payer: Self-pay | Admitting: Family Medicine

## 2020-04-11 DIAGNOSIS — M419 Scoliosis, unspecified: Secondary | ICD-10-CM

## 2020-04-11 DIAGNOSIS — M25552 Pain in left hip: Secondary | ICD-10-CM

## 2020-04-11 MED ORDER — TRAMADOL HCL 50 MG PO TABS: 50.0000 mg | ORAL_TABLET | Freq: Two times a day (BID) | ORAL | 0 refills | Status: DC | PRN

## 2020-04-11 NOTE — Telephone Encounter (Signed)
Requesting:Tramadol Contract: none UDS: none Last Visit:01/31/2020 Next Visit:01/31/2021 Last Refill: Printed in error  Please Advise

## 2020-04-11 NOTE — Telephone Encounter (Signed)
Medication: traMADol (ULTRAM) 50 MG tablet [id number]  Requesting 30 day supply   Has the patient contacted their pharmacy? No. (If no, request that the patient contact the pharmacy for the refill.) (If yes, when and what did the pharmacy advise?)  Preferred Pharmacy (with phone number [facility] street name): [facility]  [address], [place], [place] [address]  ~6.4 [address] [phone]    Agent: Please be advised that RX refills may take up to 3 business days. We ask that you follow-up with your pharmacy.

## 2020-05-04 ENCOUNTER — Other Ambulatory Visit: Payer: Self-pay | Admitting: Family Medicine

## 2020-05-04 DIAGNOSIS — M419 Scoliosis, unspecified: Secondary | ICD-10-CM

## 2020-05-04 DIAGNOSIS — M25552 Pain in left hip: Secondary | ICD-10-CM

## 2020-06-07 ENCOUNTER — Telehealth: Payer: Self-pay | Admitting: Family Medicine

## 2020-06-07 DIAGNOSIS — M25552 Pain in left hip: Secondary | ICD-10-CM

## 2020-06-07 DIAGNOSIS — M419 Scoliosis, unspecified: Secondary | ICD-10-CM

## 2020-06-07 MED ORDER — TRAMADOL HCL 50 MG PO TABS: 50.0000 mg | ORAL_TABLET | Freq: Two times a day (BID) | ORAL | 1 refills | Status: DC | PRN

## 2020-06-07 MED ORDER — TRAMADOL HCL 50 MG PO TABS: 50.0000 mg | ORAL_TABLET | Freq: Two times a day (BID) | ORAL | 0 refills | Status: DC | PRN

## 2020-06-07 NOTE — Telephone Encounter (Signed)
Medication: traMADol (ULTRAM) 50 MG tablet   Has the patient contacted their pharmacy? Yes.   (If no, request that the patient contact the pharmacy for the refill.) (If yes, when and what did the pharmacy advise?)  Preferred Pharmacy (with phone number or street name): [email or web address] [address], [place] [address] [phone]  Agent: Please be advised that RX refills may take up to 3 business days. We ask that you follow-up with your pharmacy.

## 2020-07-19 ENCOUNTER — Other Ambulatory Visit: Payer: Self-pay | Admitting: Family Medicine

## 2020-07-19 DIAGNOSIS — M419 Scoliosis, unspecified: Secondary | ICD-10-CM

## 2020-07-19 DIAGNOSIS — M25552 Pain in left hip: Secondary | ICD-10-CM

## 2020-07-19 NOTE — Telephone Encounter (Signed)
Medication:traMADol (ULTRAM) 50 MG tablet [  Has the patient contacted their pharmacy? Yes.   (If no, request that the patient contact the pharmacy for the refill.) (If yes, when and what did the pharmacy advise?) Call provider  Preferred Pharmacy (with phone number or street name):  [id number] [place], [place]: Please be advised that RX refills may take up to 3 business days. We ask that you follow-up with your pharmacy.

## 2020-07-20 MED ORDER — TRAMADOL HCL 50 MG PO TABS: 50.0000 mg | ORAL_TABLET | Freq: Two times a day (BID) | ORAL | 0 refills | Status: DC | PRN

## 2020-07-31 ENCOUNTER — Other Ambulatory Visit: Payer: Self-pay | Admitting: Family Medicine

## 2020-07-31 DIAGNOSIS — M419 Scoliosis, unspecified: Secondary | ICD-10-CM

## 2020-07-31 DIAGNOSIS — M25552 Pain in left hip: Secondary | ICD-10-CM

## 2020-07-31 NOTE — Telephone Encounter (Signed)
Medication sent to wrong pharmancy   Medication: traMADol (ULTRAM) 50 MG tablet [[id number]   Has the patient contacted their pharmacy? No. (If no, request that the patient contact the pharmacy for the refill.) (If yes, when and what did the pharmacy advise?)  Preferred Pharmacy (with phone number or street name): [facility] [place]  [place] [address]   Agent: Please be advised that RX refills may take up to 3 business days. We ask that you follow-up with your pharmacy.

## 2020-08-01 MED ORDER — TRAMADOL HCL 50 MG PO TABS: 50.0000 mg | ORAL_TABLET | Freq: Two times a day (BID) | ORAL | 2 refills | Status: DC | PRN

## 2020-10-26 ENCOUNTER — Other Ambulatory Visit: Payer: Self-pay | Admitting: Family Medicine

## 2020-10-26 DIAGNOSIS — M25552 Pain in left hip: Secondary | ICD-10-CM

## 2020-10-26 DIAGNOSIS — M419 Scoliosis, unspecified: Secondary | ICD-10-CM

## 2020-10-26 NOTE — Telephone Encounter (Signed)
Medication:  traMADol (ULTRAM) 50 MG tablet [[id number]]      Has the patient contacted their pharmacy?  (If no, request that the patient contact the pharmacy for the refill.) (If yes, when and what did the pharmacy advise?)     Preferred Pharmacy (with phone number or street name): [id number] [place], [place]  [place], [place] [address]  Phone:  [phone] Fax:  [phone]      Agent: Please be advised that RX refills may take up to 3 business days. We ask that you follow-up with your pharmacy.

## 2020-10-27 ENCOUNTER — Encounter: Payer: Self-pay | Admitting: Family Medicine

## 2020-10-27 DIAGNOSIS — M419 Scoliosis, unspecified: Secondary | ICD-10-CM

## 2020-10-27 DIAGNOSIS — M25552 Pain in left hip: Secondary | ICD-10-CM

## 2020-10-27 MED ORDER — TRAMADOL HCL 50 MG PO TABS: 50.0000 mg | ORAL_TABLET | Freq: Two times a day (BID) | ORAL | 0 refills | Status: DC | PRN

## 2020-10-27 NOTE — Telephone Encounter (Signed)
Requesting:Tramadol Contract:none UDS: none Last Visit:01/31/2020 Next Visit:01/31/2021 Last Refill:08/01/2020 2 refills  Please Advise

## 2020-10-28 MED ORDER — TRAMADOL HCL 50 MG PO TABS: 50.0000 mg | ORAL_TABLET | Freq: Two times a day (BID) | ORAL | 0 refills | Status: DC | PRN

## 2020-11-26 NOTE — Patient Instructions (Addendum)
It was very nice to see you again today, I will be in touch with your labs as soon as possible  Please to plan to see me every 6 months approximately-this is required for patients taking controlled pain medications  Have a happy holiday season- please let me know if you have difficulty affording your medications and I will try and help! Check out GoodRx online for the best local prices

## 2020-11-26 NOTE — Progress Notes (Addendum)
[facility] [place], [place], [place] [address] [phone]  Date:  11/29/2020   Name:  Kristen Ortega   DOB:  1966-02-27   MRN:  [id number]  PCP:  Darreld Mclean, MD    Chief Complaint: Medication Refill (Handicap form/)   History of Present Illness:  Kristen Ortega is a 54 y.o. very pleasant female patient who presents with the following:  Patient today for medication follow-up-history of hypertension, left renal cell carcinoma status post partial nephrectomy, significant degenerative disc disease with resultant back pain, taking tramadol Last seen by myself in February of this year  COVID-19 booster- done  Flu vaccine- done  Mammogram up-to-date Pap smear- UTD per her gyn  Colon cancer screen up-to-date Most recent labs done in February- she is fasting this am  She stopped using crestor about 6 weeks ago- too expensive   She did take her amlodipine this am   She has history of difficulty with ambulation due to her back pain.  She uses a cane to walk, would like a new handicap placard form today which we are glad to complete  BP Readings from Last 3 Encounters:  11/29/20 122/80  02/14/20 (!) 125/58  01/31/20 124/80     10/27/2020  10/27/2020   1  Tramadol Hcl 50 Mg Tablet  60.00  30  Je Cop  [id number]  [place] ([id number])  0/0  10.00 MME  Comm Ins  [place]    09/26/2020  08/01/2020   1  Tramadol Hcl 50 Mg Tablet  60.00  30  Je Cop  [id number]  [place] ([id number])  2/2  10.00 MME  Comm Ins  [place]    08/30/2020  08/01/2020   1  Tramadol Hcl 50 Mg Tablet  60.00  30  Je Cop  [id number]  [place] ([id number])  1/2  10.00 MME  Comm Ins  [place]    08/02/2020  08/01/2020   1  Tramadol Hcl 50 Mg Tablet  60.00  30  Je Cop  [id number]  [place] ([id number])  0/2  10.00 MME  Comm Ins  [place]    06/29/2020  05/05/2020   1  Tramadol Hcl 50 Mg Tablet  60.00  30  Je Cop  [id number]  [place] ([id number])  0/0  10.00 MME  Comm Ins      05/10/2020  05/05/2020   1  Tramadol Hcl 50 Mg  Tablet  60.00  30  Je Cop  [id number]         Patient Active Problem List   Diagnosis Date Noted  . Chronic back pain 03/28/2020  . Prediabetes 01/31/2020  . Dyslipidemia 01/31/2020  . Renal cell carcinoma ([facility]) 01/29/2020  . Benign essential HTN 09/17/2017  . Scoliosis of thoracolumbar spine 09/17/2017    Past Medical History:  Diagnosis Date  . Cancer ([facility]) 12/2017   kidney cancer  . Hyperlipidemia   . Hypertension   . Vaginal Pap smear, abnormal     Past Surgical History:  Procedure Laterality Date  . CESAREAN SECTION     x2  . COLONOSCOPY  02/14/2020  . COLPOSCOPY    . KIDNEY SURGERY     kidney cancer removed 12-2017  . WISDOM TOOTH EXTRACTION      Social History   Tobacco Use  . Smoking status: Former Smoker    Years: 20.00  . Smokeless tobacco: Never Used  Substance Use Topics  . Alcohol use: Yes  Comment: occasional  . Drug use: No    Family History  Problem Relation Age of Onset  . Stroke Paternal Grandmother   . Hypertension Father   . Cancer Mother        lymphoma  . Diabetes Neg Hx   . Colon cancer Neg Hx   . Colon polyps Neg Hx   . Esophageal cancer Neg Hx   . Rectal cancer Neg Hx   . Stomach cancer Neg Hx     No Known Allergies  Medication list has been reviewed and updated.  Current Outpatient Medications on File Prior to Visit  Medication Sig Dispense Refill  . acetaminophen (TYLENOL) 325 MG tablet Take 500 mg by mouth every 6 (six) hours as needed.    . calcium carbonate (TUMS - DOSED IN MG ELEMENTAL CALCIUM) 500 MG chewable tablet Chew 1 tablet by mouth daily.    . diphenhydrAMINE (BENADRYL) 50 MG tablet Take 50 mg by mouth at bedtime as needed for itching.    [place] Kitchen ibuprofen (ADVIL,MOTRIN) 200 MG tablet Take 200 mg by mouth every 6 (six) hours as needed.    . nystatin-triamcinolone (MYCOLOG II) cream Apply 1 application topically 2 (two) times daily. Use as needed 30 g 3  . Omega-3 Fatty Acids (FISH OIL) 500 MG CAPS Take by mouth.      No current facility-administered medications on file prior to visit.    Review of Systems:  As per HPI- otherwise negative.   Physical Examination: Vitals:   11/29/20 0826 11/29/20 0838  BP: (!) 166/98 122/80  Pulse: 89   Resp: 18   SpO2: 98%    Vitals:   11/29/20 0826  Weight: 234 lb (106.1 kg)  Height: 5\' 4"  (1.626 m)   Body mass index is 40.17 kg/m. Ideal Body Weight: Weight in (lb) to have BMI = 25: 145.3  GEN: no acute distress.  Obese, otherwise looks well HEENT: Atraumatic, Normocephalic.  Ears and Nose: No external deformity. CV: RRR, No M/G/R. No JVD. No thrill. No extra heart sounds. PULM: CTA B, no wheezes, crackles, rhonchi. No retractions. No resp. distress. No accessory muscle use. ABD: S, NT, ND, +BS. No rebound. No HSM. EXTR: No c/c/e PSYCH: Normally interactive. Conversant.  Walks with a cane. Mild limp   Assessment and Plan: Screening for diabetes mellitus - Plan: Comprehensive metabolic panel, Hemoglobin A1c  Screening for hyperlipidemia - Plan: Lipid panel  Screening for deficiency anemia - Plan: CBC  Benign essential HTN - Plan: amLODipine (NORVASC) 10 MG tablet  Screening for thyroid disorder - Plan: TSH  Chronic bilateral thoracic back pain  Encounter for hepatitis C screening test for low risk patient - Plan: Hepatitis C antibody  Screening for HIV without presence of risk factors - Plan: HIV Antibody (routine testing w rflx)  Fatigue, unspecified type - Plan: TSH, VITAMIN D 25 Hydroxy (Vit-D Deficiency, Fractures)  Dyslipidemia - Plan: rosuvastatin (CRESTOR) 20 MG tablet  Left hip pain - Plan: traMADol (ULTRAM) 50 MG tablet  Scoliosis of thoracolumbar spine, unspecified scoliosis type - Plan: traMADol (ULTRAM) 50 MG tablet  Patient here today for a routine follow-up visit Labs are pending as above Refilled her medications Blood pressures under good control on current regimen Refill tramadol that she uses for hip and back  pain, completed handicap placard Will plan further follow- up pending labs.  This visit occurred during the SARS-CoV-2 public health emergency.  Safety protocols were in place, including screening questions prior to the visit, additional usage  of staff PPE, and extensive cleaning of exam room while observing appropriate contact time as indicated for disinfecting solutions.    Signed Lamar Blinks, MD  Received her labs as below, message to patient  Results for orders placed or performed in visit on 11/29/20  CBC  Result Value Ref Range   WBC 9.5 4.0 - 10.5 K/uL   RBC 5.24 (H) 3.87 - 5.11 Mil/uL   Platelets 220.0 150.0 - 400.0 K/uL   Hemoglobin 14.3 12.0 - 15.0 g/dL   HCT 43.0 36.0 - 46.0 %   MCV 82.1 78.0 - 100.0 fl   MCHC 33.2 30.0 - 36.0 g/dL   RDW 14.1 11.5 - 15.5 %  Comprehensive metabolic panel  Result Value Ref Range   Sodium 141 135 - 145 mEq/L   Potassium 3.8 3.5 - 5.1 mEq/L   Chloride 102 96 - 112 mEq/L   CO2 33 (H) 19 - 32 mEq/L   Glucose, Bld 96 70 - 99 mg/dL   BUN 14 6 - 23 mg/dL   Creatinine, Ser 0.82 0.40 - 1.20 mg/dL   Total Bilirubin 0.8 0.2 - 1.2 mg/dL   Alkaline Phosphatase 93 39 - 117 U/L   AST 18 0 - 37 U/L   ALT 17 0 - 35 U/L   Total Protein 7.3 6.0 - 8.3 g/dL   Albumin 4.1 3.5 - 5.2 g/dL   GFR 81.19 >60.00 mL/min   Calcium 9.2 8.4 - 10.5 mg/dL  Hemoglobin A1c  Result Value Ref Range   Hgb A1c MFr Bld 5.6 4.6 - 6.5 %  Lipid panel  Result Value Ref Range   Cholesterol 222 (H) 0 - 200 mg/dL   Triglycerides 186.0 (H) 0.0 - 149.0 mg/dL   HDL 38.40 (L) >39.00 mg/dL   VLDL 37.2 0.0 - 40.0 mg/dL   LDL Cholesterol 146 (H) 0 - 99 mg/dL   Total CHOL/HDL Ratio 6    NonHDL 183.23   TSH  Result Value Ref Range   TSH 1.80 0.35 - 4.50 uIU/mL  VITAMIN D 25 Hydroxy (Vit-D Deficiency, Fractures)  Result Value Ref Range   VITD 11.68 (L) 30.00 - 100.00 ng/mL

## 2020-11-29 ENCOUNTER — Encounter: Payer: Self-pay | Admitting: Family Medicine

## 2020-11-29 ENCOUNTER — Other Ambulatory Visit: Payer: Self-pay

## 2020-11-29 ENCOUNTER — Ambulatory Visit (INDEPENDENT_AMBULATORY_CARE_PROVIDER_SITE_OTHER): Payer: Commercial Managed Care - PPO | Admitting: Family Medicine

## 2020-11-29 VITALS — BP 122/80 | HR 89 | Resp 18 | Ht 64.0 in | Wt 234.0 lb

## 2020-11-29 DIAGNOSIS — I10 Essential (primary) hypertension: Secondary | ICD-10-CM | POA: Diagnosis not present

## 2020-11-29 DIAGNOSIS — R5383 Other fatigue: Secondary | ICD-10-CM

## 2020-11-29 DIAGNOSIS — M25552 Pain in left hip: Secondary | ICD-10-CM

## 2020-11-29 DIAGNOSIS — Z1159 Encounter for screening for other viral diseases: Secondary | ICD-10-CM | POA: Diagnosis not present

## 2020-11-29 DIAGNOSIS — Z13220 Encounter for screening for lipoid disorders: Secondary | ICD-10-CM | POA: Diagnosis not present

## 2020-11-29 DIAGNOSIS — E785 Hyperlipidemia, unspecified: Secondary | ICD-10-CM

## 2020-11-29 DIAGNOSIS — M546 Pain in thoracic spine: Secondary | ICD-10-CM | POA: Diagnosis not present

## 2020-11-29 DIAGNOSIS — Z131 Encounter for screening for diabetes mellitus: Secondary | ICD-10-CM | POA: Diagnosis not present

## 2020-11-29 DIAGNOSIS — Z114 Encounter for screening for human immunodeficiency virus [HIV]: Secondary | ICD-10-CM

## 2020-11-29 DIAGNOSIS — E559 Vitamin D deficiency, unspecified: Secondary | ICD-10-CM

## 2020-11-29 DIAGNOSIS — M419 Scoliosis, unspecified: Secondary | ICD-10-CM

## 2020-11-29 DIAGNOSIS — Z1329 Encounter for screening for other suspected endocrine disorder: Secondary | ICD-10-CM

## 2020-11-29 DIAGNOSIS — G8929 Other chronic pain: Secondary | ICD-10-CM

## 2020-11-29 DIAGNOSIS — Z130 Encounter for screening for diseases of the blood and blood-forming organs and certain disorders involving the immune mechanism: Secondary | ICD-10-CM | POA: Diagnosis not present

## 2020-11-29 LAB — LIPID PANEL
Cholesterol: 222 mg/dL — ABNORMAL HIGH (ref 0–200)
HDL: 38.4 mg/dL — ABNORMAL LOW (ref >39.00)
LDL Cholesterol: 146 mg/dL — ABNORMAL HIGH (ref 0–99)
NonHDL: 183.23
Total CHOL/HDL Ratio: 6
Triglycerides: 186 mg/dL — ABNORMAL HIGH (ref 0.0–149.0)
VLDL: 37.2 mg/dL (ref 0.0–40.0)

## 2020-11-29 LAB — CBC
HCT: 43 % (ref 36.0–46.0)
Hemoglobin: 14.3 g/dL (ref 12.0–15.0)
MCHC: 33.2 g/dL (ref 30.0–36.0)
MCV: 82.1 fl (ref 78.0–100.0)
Platelets: 220 10*3/uL (ref 150.0–400.0)
RBC: 5.24 Mil/uL — ABNORMAL HIGH (ref 3.87–5.11)
RDW: 14.1 % (ref 11.5–15.5)
WBC: 9.5 10*3/uL (ref 4.0–10.5)

## 2020-11-29 LAB — TSH: TSH: 1.8 u[IU]/mL (ref 0.35–4.50)

## 2020-11-29 LAB — COMPREHENSIVE METABOLIC PANEL
ALT: 17 U/L (ref 0–35)
AST: 18 U/L (ref 0–37)
Albumin: 4.1 g/dL (ref 3.5–5.2)
Alkaline Phosphatase: 93 U/L (ref 39–117)
BUN: 14 mg/dL (ref 6–23)
CO2: 33 mEq/L — ABNORMAL HIGH (ref 19–32)
Calcium: 9.2 mg/dL (ref 8.4–10.5)
Chloride: 102 mEq/L (ref 96–112)
Creatinine, Ser: 0.82 mg/dL (ref 0.40–1.20)
GFR: 81.19 mL/min (ref >60.00)
Glucose, Bld: 96 mg/dL (ref 70–99)
Potassium: 3.8 mEq/L (ref 3.5–5.1)
Sodium: 141 mEq/L (ref 135–145)
Total Bilirubin: 0.8 mg/dL (ref 0.2–1.2)
Total Protein: 7.3 g/dL (ref 6.0–8.3)

## 2020-11-29 LAB — HEMOGLOBIN A1C: Hgb A1c MFr Bld: 5.6 % (ref 4.6–6.5)

## 2020-11-29 LAB — VITAMIN D 25 HYDROXY (VIT D DEFICIENCY, FRACTURES): VITD: 11.68 ng/mL — ABNORMAL LOW (ref 30.00–100.00)

## 2020-11-29 MED ORDER — TRAMADOL HCL 50 MG PO TABS: 50.0000 mg | ORAL_TABLET | Freq: Two times a day (BID) | ORAL | 3 refills | Status: DC | PRN

## 2020-11-29 MED ORDER — ROSUVASTATIN CALCIUM 20 MG PO TABS: 10.0000 mg | ORAL_TABLET | Freq: Every day | ORAL | 11 refills | Status: DC

## 2020-11-29 MED ORDER — AMLODIPINE BESYLATE 10 MG PO TABS: 10.0000 mg | ORAL_TABLET | Freq: Every day | ORAL | 3 refills | Status: DC

## 2020-11-29 MED ORDER — VITAMIN D3 1.25 MG (50000 UT) PO CAPS: ORAL_CAPSULE | ORAL | 0 refills | Status: DC

## 2020-11-29 NOTE — Addendum Note (Signed)
Addended by: Lamar Blinks C on: 11/29/2020 07:57 PM   Modules accepted: Orders

## 2020-11-30 LAB — HEPATITIS C ANTIBODY
Hepatitis C Ab: NONREACTIVE
SIGNAL TO CUT-OFF: 0.01 (ref <1.00)

## 2020-11-30 LAB — HIV ANTIBODY (ROUTINE TESTING W REFLEX): HIV 1&2 Ab, 4th Generation: NONREACTIVE

## 2021-01-27 NOTE — Progress Notes (Signed)
[facility] [place], [place], [place] [address] [phone]  Date:  01/31/2021   Name:  Kristen Ortega   DOB:  08/26/1966   MRN:  [id number]  PCP:  Darreld Mclean, MD    Chief Complaint: Annual Exam   History of Present Illness:  Kristen Ortega is a 55 y.o. very pleasant female patient who presents with the following:  Here today for a CPE Last seen by myself in December- history of hypertension, left renal cell carcinoma status post partial nephrectomy, significant degenerative disc disease with resultant back pain, taking tramadol  Uses a cane to walk due to back pain  covid done Flu done  Labs: done in December, dyslipidemia- I had her start back on crestor at that time  This is working ok for her, no SE noted - she just started taking this a few weeks ago so will not check lipids yet  mammo can be done any time, reminded pt   Visit with urology last week: Assessment: 1. Renal cell carcinoma of left kidney (HCC); clear cell, [id number]; s/p left partial nephrectomy 1/19  2. Renal calculi  3. Ureteral calculus, right  4. Abnormal urine findings   Plan: I personally reviewed the CT study from today and agree with the findings as noted below. Results discussed with the patient in detail. Specifically, I discussed the finding of a right ureteral calculus. It does not appear that the calculus is causing any significant obstruction at the present time. She is currently asymptomatic. Options for management including possible shockwave lithotripsy versus ureteroscopic stone manipulation discussed. We'll have her return to the office in approximately 2 days for a KUB to see if the stone is visualized and would then be amenable to shockwave lithotripsy. If it is not easy to visualize, we will need to discuss ureteroscopic stone manipulation with laser lithotripsy. Urine culture sent today due to abnormal  urinalysis. Begin Bactrim DS twice daily x3 days. Samples given. She will need a chest x-ray at the imaging center for completion of her surveillance for history of renal cell carcinoma.    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 [employer] Former smoker   Patient Active Problem List   Diagnosis Date Noted   Chronic back pain 03/28/2020   Prediabetes 01/31/2020   Dyslipidemia 01/31/2020   Renal cell carcinoma ([facility]) 01/29/2020   Benign essential HTN 09/17/2017   Scoliosis of thoracolumbar spine 09/17/2017    Past Medical History:  Diagnosis Date   Cancer ([facility]) 12/2017   kidney cancer   Hyperlipidemia    Hypertension    Vaginal Pap smear, abnormal     Past Surgical History:  Procedure Laterality Date   CESAREAN SECTION     x2   COLONOSCOPY  02/14/2020   COLPOSCOPY     KIDNEY SURGERY     kidney cancer removed 12-2017   WISDOM TOOTH EXTRACTION      Social History   Tobacco Use   Smoking status: Former Smoker    Years: 20.00   Smokeless tobacco: Never Used  Substance Use Topics   Alcohol use: Yes    Comment: occasional   Drug use: No    Family History  Problem Relation Age of Onset   Stroke Paternal Grandmother    Hypertension Father  Cancer Mother        lymphoma   Diabetes Neg Hx    Colon cancer Neg Hx    Colon polyps Neg Hx    Esophageal cancer Neg Hx    Rectal cancer Neg Hx    Stomach cancer Neg Hx     No Known Allergies  Medication list has been reviewed and updated.  Current Outpatient Medications on File Prior to Visit  Medication Sig Dispense Refill   acetaminophen (TYLENOL) 325 MG tablet Take 500 mg by mouth every 6 (six) hours as needed.     amLODipine (NORVASC) 10 MG tablet Take 1 tablet (10 mg total) by mouth daily. 90 tablet 3   calcium carbonate (TUMS - DOSED IN MG ELEMENTAL CALCIUM)  500 MG chewable tablet Chew 1 tablet by mouth daily.     diphenhydrAMINE (BENADRYL) 50 MG tablet Take 50 mg by mouth at bedtime as needed for itching.     ibuprofen (ADVIL,MOTRIN) 200 MG tablet Take 200 mg by mouth every 6 (six) hours as needed.     Omega-3 Fatty Acids (FISH OIL) 500 MG CAPS Take by mouth.     rosuvastatin (CRESTOR) 20 MG tablet Take 0.5-1 tablets (10-20 mg total) by mouth daily. 30 tablet 11   traMADol (ULTRAM) 50 MG tablet Take 1 tablet (50 mg total) by mouth every 12 (twelve) hours as needed. 60 tablet 3   No current facility-administered medications on file prior to visit.    Review of Systems:  As per HPI- otherwise negative.   Physical Examination: Vitals:   01/31/21 0905 01/31/21 0917  BP: (!) 142/90 118/78  Pulse: 77   Resp: 18   SpO2: 98%    Vitals:   01/31/21 0905  Weight: 230 lb (104.3 kg)  Height: 5\' 4"  (1.626 m)   Body mass index is 39.48 kg/m. Ideal Body Weight: Weight in (lb) to have BMI = 25: 145.3  GEN: no acute distress.  Overweight, looks well  HEENT: Atraumatic, Normocephalic.   Bilateral TM wnl, PEERL,EOMI.   Ears and Nose: No external deformity. CV: RRR, No M/G/R. No JVD. No thrill. No extra heart sounds. PULM: CTA B, no wheezes, crackles, rhonchi. No retractions. No resp. distress. No accessory muscle use. ABD: S, NT, ND, +BS. No rebound. No HSM. EXTR: No c/c/e PSYCH: Normally interactive. Conversant.  Uses a cane    Assessment and Plan: Physical exam  Vitamin D deficiency - Plan: Cholecalciferol (VITAMIN D3) 1.25 MG (50000 UT) CAPS  Rash and nonspecific skin eruption - Plan: nystatin-triamcinolone (MYCOLOG II) cream  Renal cell carcinoma of left kidney (HCC)  CPE today Encouraged healthy diet and exercise program rx for vitamin D- my last rx did not get filled for whatever reason Refilled cream rx that she uses as needed Labs and IUTD Discussed health maint  This visit occurred during the SARS-CoV-2 public health  emergency.  Safety protocols were in place, including screening questions prior to the visit, additional usage of staff PPE, and extensive cleaning of exam room while observing appropriate contact time as indicated for disinfecting solutions.    Signed Lamar Blinks, MD

## 2021-01-27 NOTE — Patient Instructions (Incomplete)
Good to see you again today!   Please go to [facility] imaging for your chest film at your convenience    Health Maintenance, Female Adopting a healthy lifestyle and getting preventive care are important in promoting health and wellness. Ask your health care provider about:  The right schedule for you to have regular tests and exams.  Things you can do on your own to prevent diseases and keep yourself healthy. What should I know about diet, weight, and exercise? Eat a healthy diet  Eat a diet that includes plenty of vegetables, fruits, low-fat dairy products, and lean protein.  Do not eat a lot of foods that are high in solid fats, added sugars, or sodium.   Maintain a healthy weight Body mass index (BMI) is used to identify weight problems. It estimates body fat based on height and weight. Your health care provider can help determine your BMI and help you achieve or maintain a healthy weight. Get regular exercise Get regular exercise. This is one of the most important things you can do for your health. Most adults should:  Exercise for at least 150 minutes each week. The exercise should increase your heart rate and make you sweat (moderate-intensity exercise).  Do strengthening exercises at least twice a week. This is in addition to the moderate-intensity exercise.  Spend less time sitting. Even light physical activity can be beneficial. Watch cholesterol and blood lipids Have your blood tested for lipids and cholesterol at 55 years of age, then have this test every 5 years. Have your cholesterol levels checked more often if:  Your lipid or cholesterol levels are high.  You are older than 55 years of age.  You are at high risk for heart disease. What should I know about cancer screening? Depending on your health history and family history, you may need to have cancer screening at various ages. This may include screening for:  Breast cancer.  Cervical cancer.  Colorectal  cancer.  Skin cancer.  Lung cancer. What should I know about heart disease, diabetes, and high blood pressure? Blood pressure and heart disease  High blood pressure causes heart disease and increases the risk of stroke. This is more likely to develop in people who have high blood pressure readings, are of African descent, or are overweight.  Have your blood pressure checked: ? Every 3-5 years if you are 4-34 years of age. ? Every year if you are 80 years old or older. Diabetes Have regular diabetes screenings. This checks your fasting blood sugar level. Have the screening done:  Once every three years after age 5 if you are at a normal weight and have a low risk for diabetes.  More often and at a younger age if you are overweight or have a high risk for diabetes. What should I know about preventing infection? Hepatitis B If you have a higher risk for hepatitis B, you should be screened for this virus. Talk with your health care provider to find out if you are at risk for hepatitis B infection. Hepatitis C Testing is recommended for:  Everyone born from [birth year] through 1965.  Anyone with known risk factors for hepatitis C. Sexually transmitted infections (STIs)  Get screened for STIs, including gonorrhea and chlamydia, if: ? You are sexually active and are younger than 55 years of age. ? You are older than 55 years of age and your health care provider tells you that you are at risk for this type of infection. ? Your sexual  activity has changed since you were last screened, and you are at increased risk for chlamydia or gonorrhea. Ask your health care provider if you are at risk.  Ask your health care provider about whether you are at high risk for HIV. Your health care provider may recommend a prescription medicine to help prevent HIV infection. If you choose to take medicine to prevent HIV, you should first get tested for HIV. You should then be tested every 3 months for as long as  you are taking the medicine. Pregnancy  If you are about to stop having your period (premenopausal) and you may become pregnant, seek counseling before you get pregnant.  Take 400 to 800 micrograms (mcg) of folic acid every day if you become pregnant.  Ask for birth control (contraception) if you want to prevent pregnancy. Osteoporosis and menopause Osteoporosis is a disease in which the bones lose minerals and strength with aging. This can result in bone fractures. If you are 36 years old or older, or if you are at risk for osteoporosis and fractures, ask your health care provider if you should:  Be screened for bone loss.  Take a calcium or vitamin D supplement to lower your risk of fractures.  Be given hormone replacement therapy (HRT) to treat symptoms of menopause. Follow these instructions at home: Lifestyle  Do not use any products that contain nicotine or tobacco, such as cigarettes, e-cigarettes, and chewing tobacco. If you need help quitting, ask your health care provider.  Do not use street drugs.  Do not share needles.  Ask your health care provider for help if you need support or information about quitting drugs. Alcohol use  Do not drink alcohol if: ? Your health care provider tells you not to drink. ? You are pregnant, may be pregnant, or are planning to become pregnant.  If you drink alcohol: ? Limit how much you use to 0-1 drink a day. ? Limit intake if you are breastfeeding.  Be aware of how much alcohol is in your drink. In the [place] one drink equals one 12 oz bottle of beer (355 mL), one 5 oz glass of wine (148 mL), or one 1 oz glass of hard liquor (44 mL). General instructions  Schedule regular health, dental, and eye exams.  Stay current with your vaccines.  Tell your health care provider if: ? You often feel depressed. ? You have ever been abused or do not feel safe at home. Summary  Adopting a healthy lifestyle and getting preventive care are  important in promoting health and wellness.  Follow your health care provider's instructions about healthy diet, exercising, and getting tested or screened for diseases.  Follow your health care provider's instructions on monitoring your cholesterol and blood pressure. This information is not intended to replace advice given to you by your health care provider. Make sure you discuss any questions you have with your health care provider. Document Revised: 11/25/2018 Document Reviewed: 11/25/2018 Elsevier Patient Education  2021 [employer].

## 2021-01-31 ENCOUNTER — Other Ambulatory Visit: Payer: Self-pay

## 2021-01-31 ENCOUNTER — Encounter: Payer: Self-pay | Admitting: Family Medicine

## 2021-01-31 ENCOUNTER — Ambulatory Visit (INDEPENDENT_AMBULATORY_CARE_PROVIDER_SITE_OTHER): Payer: Commercial Managed Care - PPO | Admitting: Family Medicine

## 2021-01-31 ENCOUNTER — Other Ambulatory Visit: Payer: Self-pay | Admitting: Family Medicine

## 2021-01-31 VITALS — BP 118/78 | HR 77 | Resp 18 | Ht 64.0 in | Wt 230.0 lb

## 2021-01-31 DIAGNOSIS — Z0000 Encounter for general adult medical examination without abnormal findings: Secondary | ICD-10-CM | POA: Diagnosis not present

## 2021-01-31 DIAGNOSIS — C642 Malignant neoplasm of left kidney, except renal pelvis: Secondary | ICD-10-CM | POA: Diagnosis not present

## 2021-01-31 DIAGNOSIS — R21 Rash and other nonspecific skin eruption: Secondary | ICD-10-CM

## 2021-01-31 DIAGNOSIS — E559 Vitamin D deficiency, unspecified: Secondary | ICD-10-CM

## 2021-01-31 MED ORDER — NYSTATIN-TRIAMCINOLONE 100000-0.1 UNIT/GM-% EX CREA: 1.0000 "application " | TOPICAL_CREAM | Freq: Two times a day (BID) | CUTANEOUS | 2 refills | Status: DC

## 2021-01-31 MED ORDER — VITAMIN D3 1.25 MG (50000 UT) PO CAPS: ORAL_CAPSULE | ORAL | 0 refills | Status: DC

## 2021-02-16 ENCOUNTER — Ambulatory Visit
Admission: RE | Admit: 2021-02-16 | Discharge: 2021-02-16 | Disposition: A | Discharge status: Home / Self Care | Payer: Commercial Managed Care - PPO | Source: Ambulatory Visit | Attending: Family Medicine | Admitting: Family Medicine

## 2021-02-16 ENCOUNTER — Encounter: Payer: Self-pay | Admitting: Family Medicine

## 2021-02-16 DIAGNOSIS — C642 Malignant neoplasm of left kidney, except renal pelvis: Secondary | ICD-10-CM

## 2021-03-11 NOTE — Progress Notes (Signed)
Kristen Ortega at [facility] [place], [place], [place] [address] [phone]  Date:  03/15/2021   Name:  Kristen Ortega   DOB:  Dec 31, 1965   MRN:  [id number]  PCP:  Kristen Mclean, MD    Chief Complaint: Medication Management   History of Present Illness:  Kristen Ortega is a 55 y.o. very pleasant female patient who presents with the following:  Here today for a medication follow-up Last seen by myself last month for a CPE- history of hypertension, left renal cell carcinoma status post partial nephrectomy, significant degenerative disc disease with resultant back pain, taking tramadol Uses a cane to walk due to back pain  She also recently sent me the following message  Thank you for the chest x-ray results. I have another issue I would like to discuss. I'm not sure if it would require an office visit or a phone call. My daughter(8yo) and I are no longer living with my husband. We have been separated for over a year now and I am having a lot of anxiety/stress with trying to move on as a single parent. I'm not sure if it is just anxiety/stress or if it is depression. I just know that I am not happy.  I see commercials all the time for help with depression and thought maybe there is something that will help me so I'm not always feeling sad and be able to feel hopeful for the future  She has noted trouble sleeping at night. She has to get up an get her daughter to the school bus, then will try to get things done but she feels really tired and may go back to bed and sleep during the day She does not consider suicide due to her kids-states that she is safe from self-harm She feels anxious a lot of the time- she notes both anxiety and depresion about equally  She has her 55 yo and her 3 older kids as well- they have their own homes and issues which can be stressors She has back spasms off an on which contribute to difficulty sleeping-she  does use tramadol, wonders if a muscle relaxer might be helpful   She lost her mom 2 years ago, and she lost a child in utero 43 years ago She is living with her dad now- he is retired This is an ok living situation for her and her youngest   She may not have much appetite during the day She has some anhedonia  Wt Readings from Last 3 Encounters:  03/15/21 235 lb (106.6 kg)  01/31/21 230 lb (104.3 kg)  11/29/20 234 lb (106.1 kg)   but she does enjoy time with her kids.  She has never been treated for anxiety and depression  Patient Active Problem List   Diagnosis Date Noted  . Chronic back pain 03/28/2020  . Prediabetes 01/31/2020  . Dyslipidemia 01/31/2020  . Renal cell carcinoma ([place]) 01/29/2020  . Benign essential HTN 09/17/2017  . Scoliosis of thoracolumbar spine 09/17/2017    Past Medical History:  Diagnosis Date  . Cancer ([place]) 12/2017   kidney cancer  . Hyperlipidemia   . Hypertension   . Vaginal Pap smear, abnormal     Past Surgical History:  Procedure Laterality Date  . CESAREAN SECTION     x2  . COLONOSCOPY  02/14/2020  . COLPOSCOPY    . KIDNEY SURGERY     kidney cancer removed 12-2017  .  WISDOM TOOTH EXTRACTION      Social History   Tobacco Use  . Smoking status: Former Smoker    Years: 20.00  . Smokeless tobacco: Never Used  Substance Use Topics  . Alcohol use: Yes    Comment: occasional  . Drug use: No    Family History  Problem Relation Age of Onset  . Stroke Paternal Grandmother   . Hypertension Father   . Cancer Mother        lymphoma  . Diabetes Neg Hx   . Colon cancer Neg Hx   . Colon polyps Neg Hx   . Esophageal cancer Neg Hx   . Rectal cancer Neg Hx   . Stomach cancer Neg Hx     No Known Allergies  Medication list has been reviewed and updated.  Current Outpatient Medications on File Prior to Visit  Medication Sig Dispense Refill  . acetaminophen (TYLENOL) 325 MG tablet Take 500 mg by mouth every 6 (six) hours as needed.     Marland Kitchen amLODipine (NORVASC) 10 MG tablet Take 1 tablet (10 mg total) by mouth daily. 90 tablet 3  . calcium carbonate (TUMS - DOSED IN MG ELEMENTAL CALCIUM) 500 MG chewable tablet Chew 1 tablet by mouth daily.    . Cholecalciferol (VITAMIN D3) 1.25 MG (50000 UT) CAPS Take 1 weekly for 12 weeks 12 capsule 0  . diphenhydrAMINE (BENADRYL) 50 MG tablet Take 50 mg by mouth at bedtime as needed for itching.    Marland Kitchen ibuprofen (ADVIL,MOTRIN) 200 MG tablet Take 200 mg by mouth every 6 (six) hours as needed.    . nystatin-triamcinolone (MYCOLOG II) cream Apply 1 application topically 2 (two) times daily. Use as needed 90 g 2  . Omega-3 Fatty Acids (FISH OIL) 500 MG CAPS Take by mouth.    . rosuvastatin (CRESTOR) 20 MG tablet Take 0.5-1 tablets (10-20 mg total) by mouth daily. 30 tablet 11  . traMADol (ULTRAM) 50 MG tablet Take 1 tablet (50 mg total) by mouth every 12 (twelve) hours as needed. 60 tablet 3   No current facility-administered medications on file prior to visit.    Review of Systems:  As per HPI- otherwise negative.   Physical Examination: Vitals:   03/15/21 1024 03/15/21 1044  BP: (!) 146/84 130/70  Pulse: 77   Resp: 19   Temp: 97.8 F (36.6 C)   SpO2: 97%    Vitals:   03/15/21 1024  Weight: 235 lb (106.6 kg)  Height: 5\' 4"  (1.626 m)   Body mass index is 40.34 kg/m. Ideal Body Weight: Weight in (lb) to have BMI = 25: 145.3  GEN: no acute distress.  Obese, appears her normal self.  Uses cane for ambulation HEENT: Atraumatic, Normocephalic.  Ears and Nose: No external deformity. CV: RRR, No M/G/R. No JVD. No thrill. No extra heart sounds. PULM: CTA B, no wheezes, crackles, rhonchi. No retractions. No resp. distress. No accessory muscle use. EXTR: No c/c/e PSYCH: Normally interactive. Conversant.    Assessment and Plan: Current moderate episode of major depressive disorder, unspecified whether recurrent ([place]) - Plan: FLUoxetine (PROZAC) 20 MG tablet  Scoliosis of  thoracolumbar spine, unspecified scoliosis type - Plan: methocarbamol (ROBAXIN) 500 MG tablet  Here today for discussion of depression and anxiety.  Kristen Ortega has noticed anxietyznc depression for some time- years- but this is the first time she has sought treatment.  She denies any concern about self-harm, but is at the point that she would like to improve her symptoms.  We discussed treatment options, decided to start her on fluoxetine 20 mg, increase to 40 after 2 weeks.  We discussed most common side effects, and also possible interaction with tramadol and serotonin syndrome risk.  We have discussed the symptoms of serotonin syndrome  I have asked her to update me via MyChart in a few weeks about how she is doing, certainly contact me sooner if getting worse or not doing okay  Prescribed methocarbamol for her to try at bedtime for back pain.  I advised her this may cause sedation This visit occurred during the SARS-CoV-2 public health emergency.  Safety protocols were in place, including screening questions prior to the visit, additional usage of staff PPE, and extensive cleaning of exam room while observing appropriate contact time as indicated for disinfecting solutions.    Signed Lamar Blinks, MD

## 2021-03-11 NOTE — Patient Instructions (Addendum)
Good to see you again today!  I hope that we can get your depression and anxiety symptoms improved Start on prozac 20 mg daily- increase to 40 mg after 2 weeks. Let me know if any concerns or problems, or if you do not notice improvement in 4-6 weeks   Tramadol does potentially interact with many depression meds, and can increase your risk of something called serotonin syndrome.  This is a rare condition - watch for any symptoms of  Agitation or restlessness Insomnia Confusion Rapid heart rate and high blood pressure Dilated pupils Loss of muscle coordination or twitching muscles High blood pressure Muscle rigidity Heavy sweating Diarrhea Headache Shivering Goose bumps  Ok to try robaxin as needed for muscle spasm- this may cause you to feel drowsy.  Perhaps use just at bedtime  Take care!

## 2021-03-14 ENCOUNTER — Other Ambulatory Visit (HOSPITAL_BASED_OUTPATIENT_CLINIC_OR_DEPARTMENT_OTHER): Payer: Self-pay | Admitting: Family Medicine

## 2021-03-14 DIAGNOSIS — Z1231 Encounter for screening mammogram for malignant neoplasm of breast: Secondary | ICD-10-CM

## 2021-03-15 ENCOUNTER — Other Ambulatory Visit: Payer: Self-pay

## 2021-03-15 ENCOUNTER — Ambulatory Visit (HOSPITAL_BASED_OUTPATIENT_CLINIC_OR_DEPARTMENT_OTHER)
Admission: RE | Admit: 2021-03-15 | Discharge: 2021-03-15 | Disposition: A | Discharge status: Home / Self Care | Payer: Commercial Managed Care - PPO | Source: Ambulatory Visit | Attending: Family Medicine | Admitting: Family Medicine

## 2021-03-15 ENCOUNTER — Encounter: Payer: Self-pay | Admitting: Family Medicine

## 2021-03-15 ENCOUNTER — Encounter (HOSPITAL_BASED_OUTPATIENT_CLINIC_OR_DEPARTMENT_OTHER): Payer: Self-pay

## 2021-03-15 ENCOUNTER — Ambulatory Visit (INDEPENDENT_AMBULATORY_CARE_PROVIDER_SITE_OTHER): Payer: Commercial Managed Care - PPO | Admitting: Family Medicine

## 2021-03-15 VITALS — BP 130/70 | HR 77 | Temp 97.8°F | Resp 19 | Ht 64.0 in | Wt 235.0 lb

## 2021-03-15 DIAGNOSIS — M419 Scoliosis, unspecified: Secondary | ICD-10-CM

## 2021-03-15 DIAGNOSIS — Z1231 Encounter for screening mammogram for malignant neoplasm of breast: Secondary | ICD-10-CM | POA: Diagnosis present

## 2021-03-15 DIAGNOSIS — F321 Major depressive disorder, single episode, moderate: Secondary | ICD-10-CM

## 2021-03-15 MED ORDER — METHOCARBAMOL 500 MG PO TABS
500.0000 mg | ORAL_TABLET | Freq: Three times a day (TID) | ORAL | 0 refills | Status: DC | PRN
Start: 2021-03-15 — End: 2021-04-12

## 2021-03-15 MED ORDER — FLUOXETINE HCL 20 MG PO TABS: 20.0000 mg | ORAL_TABLET | Freq: Every day | ORAL | 3 refills | Status: DC

## 2021-04-06 ENCOUNTER — Other Ambulatory Visit: Payer: Self-pay | Admitting: Family Medicine

## 2021-04-06 DIAGNOSIS — F321 Major depressive disorder, single episode, moderate: Secondary | ICD-10-CM

## 2021-04-12 ENCOUNTER — Other Ambulatory Visit: Payer: Self-pay | Admitting: Family Medicine

## 2021-04-12 DIAGNOSIS — M419 Scoliosis, unspecified: Secondary | ICD-10-CM

## 2021-04-24 ENCOUNTER — Other Ambulatory Visit: Payer: Self-pay | Admitting: Family Medicine

## 2021-04-24 ENCOUNTER — Encounter: Payer: Self-pay | Admitting: Family Medicine

## 2021-04-24 DIAGNOSIS — M25552 Pain in left hip: Secondary | ICD-10-CM

## 2021-04-24 DIAGNOSIS — M419 Scoliosis, unspecified: Secondary | ICD-10-CM

## 2021-04-24 MED ORDER — TRAMADOL HCL 50 MG PO TABS: 50.0000 mg | ORAL_TABLET | Freq: Two times a day (BID) | ORAL | 3 refills | Status: DC | PRN

## 2021-04-24 NOTE — Telephone Encounter (Signed)
Medication:traMADol (ULTRAM) 50 MG tablet   Has the patient contacted their pharmacy? Yes.   (If no, request that the patient contact the pharmacy for the refill.) (If yes, when and what did the pharmacy advise?)  Preferred Pharmacy (with phone number or street name):   [id number] - [place], [place]  Agent: Please be advised that RX refills may take up to 3 business days. We ask that you follow-up with your pharmacy.

## 2021-05-08 ENCOUNTER — Encounter: Payer: Self-pay | Admitting: Family Medicine

## 2021-05-16 ENCOUNTER — Ambulatory Visit: Payer: No Typology Code available for payment source | Admitting: Family Medicine

## 2021-06-19 ENCOUNTER — Encounter: Payer: Self-pay | Admitting: Family Medicine

## 2021-06-19 DIAGNOSIS — G8929 Other chronic pain: Secondary | ICD-10-CM

## 2021-06-19 DIAGNOSIS — F321 Major depressive disorder, single episode, moderate: Secondary | ICD-10-CM

## 2021-06-21 MED ORDER — FLUOXETINE HCL 20 MG PO TABS: 60.0000 mg | ORAL_TABLET | Freq: Every day | ORAL | 3 refills | Status: DC

## 2021-06-21 MED ORDER — CYCLOBENZAPRINE HCL 10 MG PO TABS: 5.0000 mg | ORAL_TABLET | Freq: Two times a day (BID) | ORAL | 0 refills | Status: DC | PRN

## 2021-06-21 NOTE — Addendum Note (Signed)
Addended by: Lamar Blinks C on: 06/21/2021 01:10 PM   Modules accepted: Orders

## 2021-06-21 NOTE — Addendum Note (Signed)
Addended by: Lamar Blinks C on: 06/21/2021 12:45 PM   Modules accepted: Orders

## 2021-07-28 NOTE — Progress Notes (Deleted)
[place] at [facility] [address] [place], [place] [address] [phone]  Date:  08/01/2021   Name:  Kristen Ortega   DOB:  January 29, 1966   MRN:  [id number]  PCP:  Darreld Mclean, MD    Chief Complaint: No chief complaint on file.   History of Present Illness:  Kristen Ortega is a 55 y.o. very pleasant female patient who presents with the following:  History of hypertension, dyslipidemia, left renal cell carcinoma status post partial nephrectomy, significant degenerative disc disease with resultant back pain, taking tramadol Periodic follow-up visit today Last seen by myself in March of this year - at that time she was struggling with anxiety and depression, we started her on fluoxetine  I also gave her robaxin to take for her back pain at night   Shingrix Prevnar 20 Labs done in December - started crestor   Her urologist is Curator- last visit in June  Patient Active Problem List   Diagnosis Date Noted   Chronic back pain 03/28/2020   Prediabetes 01/31/2020   Dyslipidemia 01/31/2020   Renal cell carcinoma ([place]) 01/29/2020   Benign essential HTN 09/17/2017   Scoliosis of thoracolumbar spine 09/17/2017    Past Medical History:  Diagnosis Date   Cancer ([place]) 12/2017   kidney cancer   Hyperlipidemia    Hypertension    Vaginal Pap smear, abnormal     Past Surgical History:  Procedure Laterality Date   CESAREAN SECTION     x2   COLONOSCOPY  02/14/2020   COLPOSCOPY     KIDNEY SURGERY     kidney cancer removed 12-2017   WISDOM TOOTH EXTRACTION      Social History   Tobacco Use   Smoking status: Former    Years: 20.00    Types: Cigarettes   Smokeless tobacco: Never  Substance Use Topics   Alcohol use: Yes    Comment: occasional   Drug use: No    Family History  Problem Relation Age of Onset   Stroke Paternal Grandmother    Hypertension Father    Cancer Mother        lymphoma   Diabetes Neg  Hx    Colon cancer Neg Hx    Colon polyps Neg Hx    Esophageal cancer Neg Hx    Rectal cancer Neg Hx    Stomach cancer Neg Hx     No Known Allergies  Medication list has been reviewed and updated.  Current Outpatient Medications on File Prior to Visit  Medication Sig Dispense Refill   acetaminophen (TYLENOL) 325 MG tablet Take 500 mg by mouth every 6 (six) hours as needed.     amLODipine (NORVASC) 10 MG tablet Take 1 tablet (10 mg total) by mouth daily. 90 tablet 3   calcium carbonate (TUMS - DOSED IN MG ELEMENTAL CALCIUM) 500 MG chewable tablet Chew 1 tablet by mouth daily.     Cholecalciferol (VITAMIN D3) 1.25 MG (50000 UT) CAPS Take 1 weekly for 12 weeks 12 capsule 0   cyclobenzaprine (FLEXERIL) 10 MG tablet Take 0.5-1 tablets (5-10 mg total) by mouth 2 (two) times daily as needed for muscle spasms. 30 tablet 0   diphenhydrAMINE (BENADRYL) 50 MG tablet Take 50 mg by mouth at bedtime as needed for itching.     FLUoxetine (PROZAC) 20 MG tablet Take 3 tablets (60 mg total) by mouth daily. 270 tablet 3   ibuprofen (ADVIL,MOTRIN) 200 MG tablet  Take 200 mg by mouth every 6 (six) hours as needed.     nystatin-triamcinolone (MYCOLOG II) cream Apply 1 application topically 2 (two) times daily. Use as needed 90 g 2   Omega-3 Fatty Acids (FISH OIL) 500 MG CAPS Take by mouth.     rosuvastatin (CRESTOR) 20 MG tablet Take 0.5-1 tablets (10-20 mg total) by mouth daily. 30 tablet 11   traMADol (ULTRAM) 50 MG tablet Take 1 tablet (50 mg total) by mouth every 12 (twelve) hours as needed. 60 tablet 3   No current facility-administered medications on file prior to visit.    Review of Systems:  As per HPI- otherwise negative.   Physical Examination: There were no vitals filed for this visit. There were no vitals filed for this visit. There is no height or weight on file to calculate BMI. Ideal Body Weight:    GEN: no acute distress. HEENT: Atraumatic, Normocephalic.  Ears and Nose: No  external deformity. CV: RRR, No M/G/R. No JVD. No thrill. No extra heart sounds. PULM: CTA B, no wheezes, crackles, rhonchi. No retractions. No resp. distress. No accessory muscle use. ABD: S, NT, ND, +BS. No rebound. No HSM. EXTR: No c/c/e PSYCH: Normally interactive. Conversant.    Assessment and Plan: *** This visit occurred during the SARS-CoV-2 public health emergency.  Safety protocols were in place, including screening questions prior to the visit, additional usage of staff PPE, and extensive cleaning of exam room while observing appropriate contact time as indicated for disinfecting solutions.   Signed Lamar Blinks, MD

## 2021-08-01 ENCOUNTER — Ambulatory Visit: Payer: Commercial Managed Care - PPO | Admitting: Family Medicine

## 2021-08-01 DIAGNOSIS — E559 Vitamin D deficiency, unspecified: Secondary | ICD-10-CM

## 2021-08-01 DIAGNOSIS — E785 Hyperlipidemia, unspecified: Secondary | ICD-10-CM

## 2021-08-01 DIAGNOSIS — I10 Essential (primary) hypertension: Secondary | ICD-10-CM

## 2021-09-24 ENCOUNTER — Other Ambulatory Visit: Payer: Self-pay | Admitting: *Deleted

## 2021-09-24 DIAGNOSIS — E785 Hyperlipidemia, unspecified: Secondary | ICD-10-CM

## 2021-09-24 MED ORDER — ROSUVASTATIN CALCIUM 20 MG PO TABS: 10.0000 mg | ORAL_TABLET | Freq: Every day | ORAL | 2 refills | Status: DC

## 2021-10-01 ENCOUNTER — Encounter: Payer: Self-pay | Admitting: Family Medicine

## 2021-10-01 ENCOUNTER — Other Ambulatory Visit: Payer: Self-pay | Admitting: Family Medicine

## 2021-10-01 DIAGNOSIS — M25552 Pain in left hip: Secondary | ICD-10-CM

## 2021-10-01 DIAGNOSIS — M419 Scoliosis, unspecified: Secondary | ICD-10-CM

## 2021-11-20 NOTE — Progress Notes (Addendum)
[place] at [facility] [address] [place], [place] [address] [phone]  Date:  11/21/2021   Name:  Kristen Ortega   DOB:  1966-03-15   MRN:  [id number]  PCP:  Darreld Mclean, MD    Chief Complaint: nodule (Under L breast, x Sunday morning. This is irritated and seems to be growing in size. )   History of Present Illness:  Kristen Ortega is a 55 y.o. very pleasant female patient who presents with the following:  Patient seen today for concern of a bump in her LEFT breast Most recent visit with myself in March of this year- history of hypertension, left renal cell carcinoma status post partial nephrectomy, significant degenerative disc disease with resultant back pain, taking tramadol Uses a cane to walk due to back pain At her last visit that he was having symptoms of depression and anxiety.  She had separated from her husband, she and her youngest child are living with her father.  We started her on fluoxetine at that time Her daughter is now 48, she is doing well She is not taking prozac right now- she stopped using it maybe 2-3 weeks ago as it was not fully controlling her depression symptoms. She would like to try Lexapro which was recommended by a friend  Shingles vaccine-recommended COVID booster- encouraged her to do at her pharmacy  Flu shot- give today  Labs 1 year ago, can update today Most recent mammogram in March of this year-normal  She notes a possible pimple under her left breast- started this past Sunday- today is Wednesday She feels like it is getting a bit worse It is not painful but irritating She has kept a bandage over it  She is otherwise feeling ok A week or so ago she noted blood in her urine- she is trying to contact her urologist.  No other urinary sx in particular at this time  She is a former smoker  She notes that she had a respiratory illness a couple of weeks ago.  She is mostly  recovered, but has noted some wheezing since she got sick.  Would like a refill of her albuterol inhaler if possible Patient Active Problem List   Diagnosis Date Noted   Chronic back pain 03/28/2020   Prediabetes 01/31/2020   Dyslipidemia 01/31/2020   Renal cell carcinoma ([place]) 01/29/2020   Benign essential HTN 09/17/2017   Scoliosis of thoracolumbar spine 09/17/2017    Past Medical History:  Diagnosis Date   Cancer ([place]) 12/2017   kidney cancer   Hyperlipidemia    Hypertension    Vaginal Pap smear, abnormal     Past Surgical History:  Procedure Laterality Date   CESAREAN SECTION     x2   COLONOSCOPY  02/14/2020   COLPOSCOPY     KIDNEY SURGERY     kidney cancer removed 12-2017   WISDOM TOOTH EXTRACTION      Social History   Tobacco Use   Smoking status: Former    Years: 20.00    Types: Cigarettes   Smokeless tobacco: Never  Substance Use Topics   Alcohol use: Yes    Comment: occasional   Drug use: No    Family History  Problem Relation Age of Onset   Stroke Paternal Grandmother    Hypertension Father    Cancer Mother        lymphoma   Diabetes Neg Hx    Colon cancer Neg  Hx    Colon polyps Neg Hx    Esophageal cancer Neg Hx    Rectal cancer Neg Hx    Stomach cancer Neg Hx     No Known Allergies  Medication list has been reviewed and updated.  Current Outpatient Medications on File Prior to Visit  Medication Sig Dispense Refill   acetaminophen (TYLENOL) 325 MG tablet Take 500 mg by mouth every 6 (six) hours as needed.     amLODipine (NORVASC) 10 MG tablet Take 1 tablet (10 mg total) by mouth daily. 90 tablet 3   calcium carbonate (TUMS - DOSED IN MG ELEMENTAL CALCIUM) 500 MG chewable tablet Chew 1 tablet by mouth daily.     Cholecalciferol (VITAMIN D3) 1.25 MG (50000 UT) CAPS Take 1 weekly for 12 weeks 12 capsule 0   cyclobenzaprine (FLEXERIL) 10 MG tablet Take 0.5-1 tablets (5-10 mg total) by mouth 2 (two) times daily as needed for muscle spasms. 30  tablet 0   diphenhydrAMINE (BENADRYL) 50 MG tablet Take 50 mg by mouth at bedtime as needed for itching.     FLUoxetine (PROZAC) 20 MG tablet Take 3 tablets (60 mg total) by mouth daily. 270 tablet 3   ibuprofen (ADVIL,MOTRIN) 200 MG tablet Take 200 mg by mouth every 6 (six) hours as needed.     nystatin-triamcinolone (MYCOLOG II) cream Apply 1 application topically 2 (two) times daily. Use as needed 90 g 2   Omega-3 Fatty Acids (FISH OIL) 500 MG CAPS Take by mouth.     rosuvastatin (CRESTOR) 20 MG tablet Take 0.5-1 tablets (10-20 mg total) by mouth daily. 30 tablet 2   traMADol (ULTRAM) 50 MG tablet TAKE 1 TABLET BY MOUTH EVERY 12 HOURS AS NEEDED 60 tablet 0   No current facility-administered medications on file prior to visit.    Review of Systems:  As per HPI- otherwise negative.   Physical Examination: Vitals:   11/21/21 1052  BP: 122/70  Pulse: 73  Resp: 18  Temp: 98.2 F (36.8 C)  SpO2: 97%   Vitals:   11/21/21 1052  Weight: 213 lb 12.8 oz (97 kg)  Height: 5\' 4"  (1.626 m)   Body mass index is 36.7 kg/m. Ideal Body Weight: Weight in (lb) to have BMI = 25: 145.3  GEN: no acute distress.  Obese, looks well HEENT: Atraumatic, Normocephalic.  Ears and Nose: No external deformity. CV: RRR, No M/G/R. No JVD. No thrill. No extra heart sounds. PULM: Mild expiratory wheezes are present bilaterally. No retractions. No resp. distress. No accessory muscle use. ABD: S, NT, ND. No rebound. No HSM. EXTR: No c/c/e PSYCH: Normally interactive. Conversant.  Left trunk under bra line displays a boil which has come to a head and is draining pus spontaneously  Culture of pus taken.  I gently expressed the remainder of pus from the boil.  At this point I&D does not seem necessary as it is draining well spontaneously  Dressing placed  Assessment and Plan: Boil of trunk - Plan: doxycycline (VIBRAMYCIN) 100 MG capsule, Wound culture  Screening for diabetes mellitus - Plan:  Comprehensive metabolic panel, Hemoglobin A1c  Vitamin D deficiency - Plan: VITAMIN D 25 Hydroxy (Vit-D Deficiency, Fractures)  Screening for hyperlipidemia - Plan: Lipid panel  Screening for deficiency anemia - Plan: CBC  Chronic bilateral thoracic back pain  Scoliosis of thoracolumbar spine, unspecified scoliosis type  Screening for thyroid disorder - Plan: TSH  Gross hematuria - Plan: Urine Culture, POCT urinalysis dipstick  Chronic cough -  Plan: albuterol (VENTOLIN HFA) 108 (90 Base) MCG/ACT inhaler, doxycycline (VIBRAMYCIN) 100 MG capsule  Current moderate episode of major depressive disorder, unspecified whether recurrent ([place]) - Plan: escitalopram (LEXAPRO) 10 MG tablet  Need for influenza vaccination - Plan: Flu Vaccine QUAD 6+ mos PF IM (Fluarix Quad PF)  Patient seen today for concern of a boil on her left anterior trunk.  As above, this is drained spontaneously.  Culture of pus taken, start doxycycline.  Discussed wound care and warm compresses  Refilled albuterol to use for wheezing.  Doxycycline will also help with any respiratory infection  We will have her start on Lexapro 10 mg, asked her to let me know if this is not working well for her symptoms  Urine culture pending.  Given gross hematuria and history of smoking I have asked patient to follow-up with her urologist without fail.  She agrees to do so  Signed Lamar Blinks, MD  Results for orders placed or performed in visit on 11/21/21  POCT urinalysis dipstick  Result Value Ref Range   Color, UA yellow yellow   Clarity, UA cloudy (A) clear   Glucose, UA negative negative mg/dL   Bilirubin, UA negative negative   Ketones, POC UA negative negative mg/dL   Spec Grav, UA 1.010 1.010 - 1.025   Blood, UA trace-intact (A) negative   pH, UA 6.5 5.0 - 8.0   Protein Ur, POC trace (A) negative mg/dL   Urobilinogen, UA 0.2 0.2 or 1.0 E.U./dL   Nitrite, UA Negative Negative   Leukocytes, UA Small (1+) (A)  Negative    Received labs as below, message to patient  Results for orders placed or performed in visit on 11/21/21  CBC  Result Value Ref Range   WBC 10.8 (H) 4.0 - 10.5 K/uL   RBC 4.84 3.87 - 5.11 Mil/uL   Platelets 244.0 150.0 - 400.0 K/uL   Hemoglobin 13.6 12.0 - 15.0 g/dL   HCT 40.5 36.0 - 46.0 %   MCV 83.6 78.0 - 100.0 fl   MCHC 33.7 30.0 - 36.0 g/dL   RDW 13.9 11.5 - 15.5 %  Comprehensive metabolic panel  Result Value Ref Range   Sodium 141 135 - 145 mEq/L   Potassium 3.3 (L) 3.5 - 5.1 mEq/L   Chloride 102 96 - 112 mEq/L   CO2 31 19 - 32 mEq/L   Glucose, Bld 92 70 - 99 mg/dL   BUN 13 6 - 23 mg/dL   Creatinine, Ser 0.85 0.40 - 1.20 mg/dL   Total Bilirubin 0.5 0.2 - 1.2 mg/dL   Alkaline Phosphatase 84 39 - 117 U/L   AST 27 0 - 37 U/L   ALT 18 0 - 35 U/L   Total Protein 7.4 6.0 - 8.3 g/dL   Albumin 4.0 3.5 - 5.2 g/dL   GFR 77.23 >60.00 mL/min   Calcium 9.3 8.4 - 10.5 mg/dL  Hemoglobin A1c  Result Value Ref Range   Hgb A1c MFr Bld 5.8 4.6 - 6.5 %  Lipid panel  Result Value Ref Range   Cholesterol 150 0 - 200 mg/dL   Triglycerides 126.0 0.0 - 149.0 mg/dL   HDL 39.90 >39.00 mg/dL   VLDL 25.2 0.0 - 40.0 mg/dL   LDL Cholesterol 85 0 - 99 mg/dL   Total CHOL/HDL Ratio 4    NonHDL 110.07   TSH  Result Value Ref Range   TSH 0.68 0.35 - 5.50 uIU/mL  VITAMIN D 25 Hydroxy (Vit-D Deficiency, Fractures)  Result  Value Ref Range   VITD 26.20 (L) 30.00 - 100.00 ng/mL  POCT urinalysis dipstick  Result Value Ref Range   Color, UA yellow yellow   Clarity, UA cloudy (A) clear   Glucose, UA negative negative mg/dL   Bilirubin, UA negative negative   Ketones, POC UA negative negative mg/dL   Spec Grav, UA 1.010 1.010 - 1.025   Blood, UA trace-intact (A) negative   pH, UA 6.5 5.0 - 8.0   Protein Ur, POC trace (A) negative mg/dL   Urobilinogen, UA 0.2 0.2 or 1.0 E.U./dL   Nitrite, UA Negative Negative   Leukocytes, UA Small (1+) (A) Negative

## 2021-11-20 NOTE — Patient Instructions (Addendum)
Good to see you today- I will be in touch with your labs asap  For the boil- keep it covered except for doing your hot compress or hot water treatment. Please apply a hot compress or use a hot shower or bath twice a day, then gently express pus. Do this until pus is gone Doxycycline antibiotic also for infection.  I will be in touch with your culture   Blood in urine- culture pending.  Because you had visible blood in your urine please do see Dr Felipa Eth for follow-up asap  Wheezing- albuterol as needed  We will try lexapro 10 mg for depression sx- let me know how this is working for you  Recommend latest covid booster at pharmacy I would also suggest getting the shingles series if affordable for you   Flu shot today

## 2021-11-21 ENCOUNTER — Ambulatory Visit (INDEPENDENT_AMBULATORY_CARE_PROVIDER_SITE_OTHER): Payer: Commercial Managed Care - PPO | Admitting: Family Medicine

## 2021-11-21 ENCOUNTER — Encounter: Payer: Self-pay | Admitting: Family Medicine

## 2021-11-21 VITALS — BP 122/70 | HR 73 | Temp 98.2°F | Resp 18 | Ht 64.0 in | Wt 213.8 lb

## 2021-11-21 DIAGNOSIS — Z131 Encounter for screening for diabetes mellitus: Secondary | ICD-10-CM

## 2021-11-21 DIAGNOSIS — R310 Gross hematuria: Secondary | ICD-10-CM

## 2021-11-21 DIAGNOSIS — L02229 Furuncle of trunk, unspecified: Secondary | ICD-10-CM

## 2021-11-21 DIAGNOSIS — G8929 Other chronic pain: Secondary | ICD-10-CM

## 2021-11-21 DIAGNOSIS — Z23 Encounter for immunization: Secondary | ICD-10-CM

## 2021-11-21 DIAGNOSIS — M419 Scoliosis, unspecified: Secondary | ICD-10-CM

## 2021-11-21 DIAGNOSIS — R053 Chronic cough: Secondary | ICD-10-CM

## 2021-11-21 DIAGNOSIS — Z1329 Encounter for screening for other suspected endocrine disorder: Secondary | ICD-10-CM | POA: Diagnosis not present

## 2021-11-21 DIAGNOSIS — Z13220 Encounter for screening for lipoid disorders: Secondary | ICD-10-CM

## 2021-11-21 DIAGNOSIS — Z130 Encounter for screening for diseases of the blood and blood-forming organs and certain disorders involving the immune mechanism: Secondary | ICD-10-CM

## 2021-11-21 DIAGNOSIS — E559 Vitamin D deficiency, unspecified: Secondary | ICD-10-CM

## 2021-11-21 DIAGNOSIS — M546 Pain in thoracic spine: Secondary | ICD-10-CM

## 2021-11-21 DIAGNOSIS — F321 Major depressive disorder, single episode, moderate: Secondary | ICD-10-CM

## 2021-11-21 LAB — POCT URINALYSIS DIP (MANUAL ENTRY)
Bilirubin, UA: NEGATIVE
Glucose, UA: NEGATIVE mg/dL
Ketones, POC UA: NEGATIVE mg/dL
Nitrite, UA: NEGATIVE
Spec Grav, UA: 1.01 (ref 1.010–1.025)
Urobilinogen, UA: 0.2 E.U./dL
pH, UA: 6.5 (ref 5.0–8.0)

## 2021-11-21 LAB — LIPID PANEL
Cholesterol: 150 mg/dL (ref 0–200)
HDL: 39.9 mg/dL (ref >39.00)
LDL Cholesterol: 85 mg/dL (ref 0–99)
NonHDL: 110.07
Total CHOL/HDL Ratio: 4
Triglycerides: 126 mg/dL (ref 0.0–149.0)
VLDL: 25.2 mg/dL (ref 0.0–40.0)

## 2021-11-21 LAB — CBC
HCT: 40.5 % (ref 36.0–46.0)
Hemoglobin: 13.6 g/dL (ref 12.0–15.0)
MCHC: 33.7 g/dL (ref 30.0–36.0)
MCV: 83.6 fl (ref 78.0–100.0)
Platelets: 244 10*3/uL (ref 150.0–400.0)
RBC: 4.84 Mil/uL (ref 3.87–5.11)
RDW: 13.9 % (ref 11.5–15.5)
WBC: 10.8 10*3/uL — ABNORMAL HIGH (ref 4.0–10.5)

## 2021-11-21 LAB — COMPREHENSIVE METABOLIC PANEL
ALT: 18 U/L (ref 0–35)
AST: 27 U/L (ref 0–37)
Albumin: 4 g/dL (ref 3.5–5.2)
Alkaline Phosphatase: 84 U/L (ref 39–117)
BUN: 13 mg/dL (ref 6–23)
CO2: 31 mEq/L (ref 19–32)
Calcium: 9.3 mg/dL (ref 8.4–10.5)
Chloride: 102 mEq/L (ref 96–112)
Creatinine, Ser: 0.85 mg/dL (ref 0.40–1.20)
GFR: 77.23 mL/min (ref >60.00)
Glucose, Bld: 92 mg/dL (ref 70–99)
Potassium: 3.3 mEq/L — ABNORMAL LOW (ref 3.5–5.1)
Sodium: 141 mEq/L (ref 135–145)
Total Bilirubin: 0.5 mg/dL (ref 0.2–1.2)
Total Protein: 7.4 g/dL (ref 6.0–8.3)

## 2021-11-21 LAB — VITAMIN D 25 HYDROXY (VIT D DEFICIENCY, FRACTURES): VITD: 26.2 ng/mL — ABNORMAL LOW (ref 30.00–100.00)

## 2021-11-21 LAB — TSH: TSH: 0.68 u[IU]/mL (ref 0.35–5.50)

## 2021-11-21 LAB — HEMOGLOBIN A1C: Hgb A1c MFr Bld: 5.8 % (ref 4.6–6.5)

## 2021-11-21 MED ORDER — ALBUTEROL SULFATE HFA 108 (90 BASE) MCG/ACT IN AERS: 2.0000 | INHALATION_SPRAY | Freq: Four times a day (QID) | RESPIRATORY_TRACT | 6 refills | Status: DC | PRN

## 2021-11-21 MED ORDER — ESCITALOPRAM OXALATE 10 MG PO TABS: 10.0000 mg | ORAL_TABLET | Freq: Every day | ORAL | 6 refills | Status: DC

## 2021-11-21 MED ORDER — DOXYCYCLINE HYCLATE 100 MG PO CAPS: 100.0000 mg | ORAL_CAPSULE | Freq: Two times a day (BID) | ORAL | 0 refills | Status: DC

## 2021-11-25 LAB — URINE CULTURE
MICRO NUMBER:: 12726066
Result:: NO GROWTH
SPECIMEN QUALITY:: ADEQUATE

## 2021-11-25 LAB — WOUND CULTURE
MICRO NUMBER:: 12726065
RESULT:: NO GROWTH
SPECIMEN QUALITY:: ADEQUATE

## 2021-12-25 ENCOUNTER — Other Ambulatory Visit: Payer: Self-pay

## 2021-12-25 ENCOUNTER — Ambulatory Visit (INDEPENDENT_AMBULATORY_CARE_PROVIDER_SITE_OTHER): Payer: Commercial Managed Care - PPO | Admitting: Urology

## 2021-12-25 ENCOUNTER — Encounter: Payer: Self-pay | Admitting: Urology

## 2021-12-25 VITALS — BP 124/87 | HR 75 | Ht 64.0 in | Wt 213.0 lb

## 2021-12-25 DIAGNOSIS — N200 Calculus of kidney: Secondary | ICD-10-CM | POA: Diagnosis not present

## 2021-12-25 DIAGNOSIS — R310 Gross hematuria: Secondary | ICD-10-CM | POA: Diagnosis not present

## 2021-12-25 DIAGNOSIS — C642 Malignant neoplasm of left kidney, except renal pelvis: Secondary | ICD-10-CM

## 2021-12-25 DIAGNOSIS — R8290 Unspecified abnormal findings in urine: Secondary | ICD-10-CM

## 2021-12-25 LAB — MICROSCOPIC EXAMINATION
RBC, Urine: >30 /hpf — AB (ref 0–2)
Renal Epithel, UA: NONE SEEN /hpf

## 2021-12-25 LAB — URINALYSIS, ROUTINE W REFLEX MICROSCOPIC
Bilirubin, UA: NEGATIVE
Glucose, UA: NEGATIVE
Ketones, UA: NEGATIVE
Nitrite, UA: NEGATIVE
Specific Gravity, UA: 1.02 (ref 1.005–1.030)
Urobilinogen, Ur: 0.2 mg/dL (ref 0.2–1.0)
pH, UA: 0.2 — CL (ref 5.0–7.5)

## 2021-12-25 NOTE — Progress Notes (Signed)
Urological Symptom Review  Patient is experiencing the following symptoms: Blood in urine   Review of Systems  Gastrointestinal (upper)  : Negative for upper GI symptoms  Gastrointestinal (lower) : Negative for lower GI symptoms  Constitutional : Negative for symptoms  Skin: Negative for skin symptoms  Eyes: Negative for eye symptoms  Ear/Nose/Throat : Negative for Ear/Nose/Throat symptoms  Hematologic/Lymphatic: Negative for Hematologic/Lymphatic symptoms  Cardiovascular : Negative for cardiovascular symptoms  Respiratory : Negative for respiratory symptoms  Endocrine: Negative for endocrine symptoms  Musculoskeletal: Negative for musculoskeletal symptoms  Neurological: Negative for neurological symptoms  Psychologic: Depression Anxiety

## 2021-12-25 NOTE — Progress Notes (Signed)
Ports   Assessment: 1. Renal cell carcinoma of left kidney (HCC); clear cell carcinoma, T1bNxMx; s/p left partial nephrectomy 1/19   2. Gross hematuria   3. Nephrolithiasis   4. Abnormal urine findings     Plan: I reviewed the patient's chart including her prior urology notes from [facility].  I also reviewed her recent laboratory results from her PCP. Today I had a discussion with the patient regarding the findings of gross hematuria including the implications and differential diagnoses associated with it.  I also discussed recommendations for further evaluation including the rationale for upper tract imaging and cystoscopy.  I discussed the nature of these procedures including potential risk and complications.  The patient expressed an understanding of these issues. Schedule for CT hematuria protocol Urine culture sent today Will contact her with results and to arrange follow-up   Chief Complaint: Chief Complaint  Patient presents with   kidney cancer   Nephrolithiasis   Hematuria    HPI: Kristen Ortega is a 56 y.o. female who presents for continued evaluation of left renal cell carcinoma and bilateral nephrolithiasis.  She was previously followed in [facility] and was last seen in June 2022.  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  Lasix renogram showed split function of 47% on left and 53% on right, obstruction on left.  She underwent cystoscopy, bilateral retrograde pyelograms, right ureteroscopy with holmium laser lithotripsy, insertion of bilateral ureteral stents on 11/19/17. The right renal calculi were fragmented. KUB from 11/27/17 showed excellent fragmentation of the right renal calculi with fragments along the stent in  the distal right ureter.  Her right stent was removed on 12/02/18.  She is s/p left partial nephrectomy and open pyelolithotomy on 12/30/17. 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 She did well since the procedure. Her left ureteral stent was removed on 02/06/18.  Labs from 6/19: Cr 0.80 CT from 6/19 showed no recurrent or metastatic disease, bilateral nephrolithiasis. Labs from 12/14/2018 showed a normal CBC. CMP showed normal renal function and normal LFTs. Her potassium was abnormal at 2.8. She was treated with potassium supplementation. A repeat CMP from 12/22/2018 showed a normal potassium of 4.2, normal renal function, and normal LFTs. CT from 1/20 showed no recurrence, no metastatic disease, and bilateral nephrolithiasis. CXR from 1/20 showed no acute changes. CMP 1/21: Cr 0.9, normal LFT's CBC 1/21: Normal CT from 1/21 showed changes consistent with a left partial nephrectomy with no suggestion of recurrence or metastatic disease in the abdomen or pelvis, multiple nonobstructive renal calculi bilaterally. Chest x-ray from 1/21 showed no acute changes. CMP from 2/22: Creatinine 0.82, normal LFTs CBC from 2/22: Normal  She underwent evaluation with a CT scan on 01/24/2021. The study showed no evidence of recurrent disease in the left kidney or evidence of metastatic disease. It did show a 8 mm right UPJ stone with some mild wall enhancement of the right proximal ureter, no evidence of obstruction, bilateral renal calculi. Urine culture from 01/24/2021: 50-100 K E. coli. She was started on Bactrim DS. Chest x-ray from 3/22 showed no acute abnormalities.  She underwent right ESL on 02/06/21. She passed stone fragments following the procedure. KUB from 02/14/21 showed a 3 x 6 mm calcification lateral to the right L4  transverse process. KUB from 02/28/2021 showed bilateral renal calcifications and calcifications in the area of the right distal ureter measuring 4 x 5 mm. KUB from  03/16/2021 showed further distal migration of the calcifications in the right distal ureter. KUB imaging from 04/04/2021 showed persistent calcifications in the area of the right distal ureter unchanged from prior studies. She underwent cystoscopy, bilateral retrograde pyelograms, and right ureteroscopy on 04/23/2021. No stones were seen in the right ureter.  She returns today for follow-up.  She remains gross hematuria for the past 2-3 months.  She is also had some intermittent bilateral flank pain.  She reports the urine is brown in color.  She typically notices this in the morning.  No dysuria.  Urine culture from 11/21/2021 showed no growth. CMP from 11/21/2021 showed normal renal function and normal LFTs. CBC showed a white blood cell count of 10.8.  Portions of the above documentation were copied from a prior visit for review purposes only.  Allergies: No Known Allergies  PMH: Past Medical History:  Diagnosis Date   Cancer ([place]) 12/2017   kidney cancer   Hyperlipidemia    Hypertension    Vaginal Pap smear, abnormal     PSH: Past Surgical History:  Procedure Laterality Date   CESAREAN SECTION     x2   COLONOSCOPY  02/14/2020   COLPOSCOPY     KIDNEY SURGERY     kidney cancer removed 12-2017   WISDOM TOOTH EXTRACTION      SH: Social History   Tobacco Use   Smoking status: Former    Years: 20.00    Types: Cigarettes   Smokeless tobacco: Never  Substance Use Topics   Alcohol use: Yes    Comment: occasional   Drug use: No    ROS: Constitutional:  Negative for fever, chills, weight loss CV: Negative for chest pain, previous MI, hypertension Respiratory:  Negative for shortness of breath, wheezing, sleep apnea, frequent cough GI:  Negative for nausea, vomiting, bloody stool, GERD  PE: BP 124/87 (BP Location: Left Arm)    Pulse 75    Ht 5\' 4"  (1.626 m)    Wt 213 lb (96.6 kg)    BMI 36.56 kg/m  GENERAL APPEARANCE:  Well appearing, well developed, well nourished,  NAD HEENT:  Atraumatic, normocephalic, oropharynx clear NECK:  Supple without lymphadenopathy or thyromegaly ABDOMEN:  Soft, non-tender, no masses EXTREMITIES:  Moves all extremities well, without clubbing, cyanosis, or edema NEUROLOGIC:  Alert and oriented x 3, normal gait, CN II-XII grossly intact MENTAL STATUS:  appropriate BACK:  Non-tender to palpation, No CVAT SKIN:  Warm, dry, and intact   Results: No results found for this or any previous visit (from the past 24 hour(s)).

## 2021-12-27 LAB — URINE CULTURE

## 2021-12-28 ENCOUNTER — Encounter: Payer: Self-pay | Admitting: Family Medicine

## 2021-12-28 MED ORDER — DICLOFENAC SODIUM 75 MG PO TBEC: 75.0000 mg | DELAYED_RELEASE_TABLET | Freq: Two times a day (BID) | ORAL | 0 refills | Status: DC

## 2022-01-12 ENCOUNTER — Other Ambulatory Visit: Payer: Self-pay | Admitting: Family Medicine

## 2022-01-12 DIAGNOSIS — I10 Essential (primary) hypertension: Secondary | ICD-10-CM

## 2022-01-16 ENCOUNTER — Other Ambulatory Visit: Payer: Self-pay

## 2022-01-16 ENCOUNTER — Ambulatory Visit
Admission: RE | Admit: 2022-01-16 | Discharge: 2022-01-16 | Disposition: A | Discharge status: Home / Self Care | Payer: Commercial Managed Care - PPO | Source: Ambulatory Visit | Attending: Urology | Admitting: Urology

## 2022-01-16 DIAGNOSIS — R310 Gross hematuria: Secondary | ICD-10-CM

## 2022-01-16 DIAGNOSIS — N200 Calculus of kidney: Secondary | ICD-10-CM

## 2022-01-16 DIAGNOSIS — C642 Malignant neoplasm of left kidney, except renal pelvis: Secondary | ICD-10-CM

## 2022-01-16 MED ORDER — IOPAMIDOL (ISOVUE-300) INJECTION 61%
125.0000 mL | Freq: Once | INTRAVENOUS | Status: AC | PRN
  Administered 2022-01-16: 125 mL via INTRAVENOUS

## 2022-01-18 ENCOUNTER — Telehealth: Payer: Self-pay

## 2022-01-18 NOTE — Telephone Encounter (Signed)
-----   Message from Primus Bravo, MD sent at 01/17/2022 12:51 PM EST ----- Please notify patient that her CT does not show any evidence of recurrent kidney cancer.  She does have some small kidney stones, but there is no evidence of obstruction from stones or stones attempting to pass. Given these results and her ongoing hematuria, she needs further evaluation with cystoscopy. Please have her schedule an appointment for cystoscopy in the next 2-3 weeks.

## 2022-01-18 NOTE — Telephone Encounter (Signed)
Patient called and notified. Appt scheduled with patient.

## 2022-02-01 ENCOUNTER — Encounter: Payer: Self-pay | Admitting: Urology

## 2022-02-01 ENCOUNTER — Other Ambulatory Visit: Payer: Self-pay | Admitting: Family Medicine

## 2022-02-01 ENCOUNTER — Other Ambulatory Visit: Payer: Self-pay

## 2022-02-01 ENCOUNTER — Ambulatory Visit (INDEPENDENT_AMBULATORY_CARE_PROVIDER_SITE_OTHER): Payer: Commercial Managed Care - PPO | Admitting: Urology

## 2022-02-01 VITALS — BP 120/72 | HR 61 | Wt 212.6 lb

## 2022-02-01 DIAGNOSIS — R310 Gross hematuria: Secondary | ICD-10-CM

## 2022-02-01 DIAGNOSIS — N200 Calculus of kidney: Secondary | ICD-10-CM

## 2022-02-01 DIAGNOSIS — C642 Malignant neoplasm of left kidney, except renal pelvis: Secondary | ICD-10-CM

## 2022-02-01 LAB — URINALYSIS, ROUTINE W REFLEX MICROSCOPIC
Bilirubin, UA: NEGATIVE
Glucose, UA: NEGATIVE
Ketones, UA: NEGATIVE
Nitrite, UA: NEGATIVE
Specific Gravity, UA: 1.015 (ref 1.005–1.030)
Urobilinogen, Ur: 0.2 mg/dL (ref 0.2–1.0)
pH, UA: 6.5 (ref 5.0–7.5)

## 2022-02-01 LAB — MICROSCOPIC EXAMINATION: Renal Epithel, UA: NONE SEEN /hpf

## 2022-02-01 MED ORDER — CIPROFLOXACIN HCL 500 MG PO TABS
500.0000 mg | ORAL_TABLET | Freq: Once | ORAL | Status: AC
  Administered 2022-02-01: 500 mg via ORAL

## 2022-02-01 NOTE — Progress Notes (Signed)
Ports   Assessment: 1. Gross hematuria   2. Nephrolithiasis   3. Renal cell carcinoma of left kidney (HCC); clear cell carcinoma, T1bNxMx; s/p left partial nephrectomy 1/19     Plan: I personally reviewed the CT study from 01/17/2022 showing postoperative changes status post left partial nephrectomy, bilateral renal calculi.  Results discussed with the patient.  No evidence of significant abnormality on CT or cystoscopy to account for her gross hematuria. Urine sent for cytology, reflex FISH Cipro x 1 following cystoscopy Return to office in 6 weeks.  Chief Complaint: Chief Complaint  Patient presents with   Hematuria    HPI: Kristen Ortega is a 56 y.o. female who presents for continued evaluation of gross hematuria. She has a history of left renal cell carcinoma and bilateral nephrolithiasis.  She was previously followed in [facility] and was last seen in June 2022.  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  Lasix renogram showed split function of 47% on left and 53% on right, obstruction on left.  She underwent cystoscopy, bilateral retrograde pyelograms, right ureteroscopy with holmium laser lithotripsy, insertion of bilateral ureteral stents on 11/19/17. The right renal calculi were fragmented. KUB from 11/27/17 showed excellent fragmentation of the right renal calculi with fragments along the stent in the distal right ureter.  Her right stent was removed on 12/02/18.  She is s/p left partial nephrectomy and open pyelolithotomy on 12/30/17. 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 She did well since the procedure. Her left ureteral stent was removed on 02/06/18.  Labs from 6/19: Cr 0.80 CT from 6/19 showed no recurrent or  metastatic disease, bilateral nephrolithiasis. Labs from 12/14/2018 showed a normal CBC. CMP showed normal renal function and normal LFTs. Her potassium was abnormal at 2.8. She was treated with potassium supplementation. A repeat CMP from 12/22/2018 showed a normal potassium of 4.2, normal renal function, and normal LFTs. CT from 1/20 showed no recurrence, no metastatic disease, and bilateral nephrolithiasis. CXR from 1/20 showed no acute changes. CMP 1/21: Cr 0.9, normal LFT's CBC 1/21: Normal CT from 1/21 showed changes consistent with a left partial nephrectomy with no suggestion of recurrence or metastatic disease in the abdomen or pelvis, multiple nonobstructive renal calculi bilaterally. Chest x-ray from 1/21 showed no acute changes. CMP from 2/22: Creatinine 0.82, normal LFTs CBC from 2/22: Normal  She underwent evaluation with a CT scan on 01/24/2021. The study showed no evidence of recurrent disease in the left kidney or evidence of metastatic disease. It did show a 8 mm right UPJ stone with some mild wall enhancement of the right proximal ureter, no evidence of obstruction, bilateral renal calculi. Urine culture from 01/24/2021: 50-100 K E. coli. She was started on Bactrim DS. Chest x-ray from 3/22 showed no acute abnormalities.  She underwent right ESL on 02/06/21. She passed stone fragments following the procedure. KUB from 02/14/21 showed a 3 x 6 mm calcification lateral to the right L4 transverse process. KUB from 02/28/2021 showed bilateral renal calcifications and calcifications in the area of the right distal ureter measuring 4 x 5 mm. KUB from 03/16/2021 showed further distal migration of the calcifications in the right distal ureter. KUB imaging from 04/04/2021 showed persistent calcifications in the area of the right distal ureter  unchanged from prior studies. She underwent cystoscopy, bilateral retrograde pyelograms, and right ureteroscopy on 04/23/2021. No stones were seen in the right  ureter.  At her visit in January 2023, she reported gross hematuria for the past 2-3 months.  She reported some intermittent bilateral flank pain.  Her urine has been brown in color, typically in the morning.  No dysuria.  Urine culture from 11/21/2021 showed no growth. CMP from 11/21/2021 showed normal renal function and normal LFTs. CBC showed a white blood cell count of 10.8. Urinalysis from 12/25/2021 showed >30 RBCs and 6-10 WBCs.  Urine culture showed <10K mixed flora. CT hematuria protocol from 01/17/2022 demonstrated multiple small bilateral renal calculi greater on the left, unchanged postoperative appearance of the left kidney status post partial nephrectomy, no evidence of recurrence or metastatic disease, no hydronephrosis.  She presents today for further evaluation with cystoscopy.  She has continued to have episodes of gross hematuria.  The last episode occurred earlier this week.  No current gross hematuria.  No dysuria or flank pain.  Portions of the above documentation were copied from a prior visit for review purposes only.  Allergies: No Known Allergies  PMH: Past Medical History:  Diagnosis Date   Cancer ([place]) 12/2017   kidney cancer   Hyperlipidemia    Hypertension    Vaginal Pap smear, abnormal     PSH: Past Surgical History:  Procedure Laterality Date   CESAREAN SECTION     x2   COLONOSCOPY  02/14/2020   COLPOSCOPY     KIDNEY SURGERY     kidney cancer removed 12-2017   WISDOM TOOTH EXTRACTION      SH: Social History   Tobacco Use   Smoking status: Former    Years: 20.00    Types: Cigarettes   Smokeless tobacco: Never  Substance Use Topics   Alcohol use: Yes    Comment: occasional   Drug use: No    ROS: Constitutional:  Negative for fever, chills, weight loss CV: Negative for chest pain, previous MI, hypertension Respiratory:  Negative for shortness of breath, wheezing, sleep apnea, frequent cough GI:  Negative for nausea, vomiting, bloody stool,  GERD  PE: BP 120/72    Pulse 61    Wt 212 lb 9.6 oz (96.4 kg)    BMI 36.49 kg/m  GENERAL APPEARANCE:  Well appearing, well developed, well nourished, NAD HEENT:  Atraumatic, normocephalic, oropharynx clear NECK:  Supple without lymphadenopathy or thyromegaly ABDOMEN:  Soft, non-tender, no masses EXTREMITIES:  Moves all extremities well, without clubbing, cyanosis, or edema NEUROLOGIC:  Alert and oriented x 3, normal gait, CN II-XII grossly intact MENTAL STATUS:  appropriate BACK:  Non-tender to palpation, No CVAT SKIN:  Warm, dry, and intact   Results: U/A:  6-10 WBC, 3-10 RBC, few bacteria  Procedure:  Flexible Cystourethroscopy  Pre-operative Diagnosis: Gross hematuria  Post-operative Diagnosis: Gross hematuria  Anesthesia:  local with lidocaine jelly  Surgical Narrative:  After appropriate informed consent was obtained, the patient was prepped and draped in the usual sterile fashion in the supine position.  The patient was correctly identified and the proper procedure delineated prior to proceeding.  Sterile lidocaine gel was instilled in the urethra. The flexible cystoscope was introduced without difficulty.  Findings:  Urethra: Normal  Bladder:  squamous metaplasia, no papillary lesions seen  Ureteral orifices: normal  Additional findings: none  Saline bladder wash for cytology was performed.    The cystoscope was then removed.  The patient tolerated the procedure  well.

## 2022-02-04 LAB — CYTOLOGY, URINE

## 2022-03-14 ENCOUNTER — Other Ambulatory Visit: Payer: Self-pay | Admitting: Family Medicine

## 2022-03-14 ENCOUNTER — Encounter: Payer: Self-pay | Admitting: Family Medicine

## 2022-03-14 ENCOUNTER — Ambulatory Visit (INDEPENDENT_AMBULATORY_CARE_PROVIDER_SITE_OTHER): Payer: Commercial Managed Care - PPO | Admitting: Urology

## 2022-03-14 ENCOUNTER — Encounter: Payer: Self-pay | Admitting: Urology

## 2022-03-14 VITALS — BP 124/73 | HR 71

## 2022-03-14 DIAGNOSIS — C642 Malignant neoplasm of left kidney, except renal pelvis: Secondary | ICD-10-CM

## 2022-03-14 DIAGNOSIS — N200 Calculus of kidney: Secondary | ICD-10-CM | POA: Diagnosis not present

## 2022-03-14 DIAGNOSIS — F321 Major depressive disorder, single episode, moderate: Secondary | ICD-10-CM

## 2022-03-14 DIAGNOSIS — R310 Gross hematuria: Secondary | ICD-10-CM

## 2022-03-14 LAB — URINALYSIS, ROUTINE W REFLEX MICROSCOPIC
Bilirubin, UA: NEGATIVE
Glucose, UA: NEGATIVE
Ketones, UA: NEGATIVE
Nitrite, UA: NEGATIVE
Protein,UA: NEGATIVE
Specific Gravity, UA: 1.02 (ref 1.005–1.030)
Urobilinogen, Ur: 0.2 mg/dL (ref 0.2–1.0)
pH, UA: 6.5 (ref 5.0–7.5)

## 2022-03-14 LAB — MICROSCOPIC EXAMINATION: Renal Epithel, UA: NONE SEEN /hpf

## 2022-03-14 MED ORDER — DICLOFENAC SODIUM 75 MG PO TBEC: 75.0000 mg | DELAYED_RELEASE_TABLET | Freq: Two times a day (BID) | ORAL | 0 refills | Status: DC | PRN

## 2022-03-14 NOTE — Progress Notes (Signed)
Urine cytology sent via [employer]  ?Tracking number [id number] ?

## 2022-03-14 NOTE — Progress Notes (Signed)
? ?Assessment: ?1. Gross hematuria   ?2. Nephrolithiasis   ?3. Renal cell carcinoma of left kidney (HCC); clear cell carcinoma, T1bNxMx; s/p left partial nephrectomy 1/19   ? ? ? ?Plan: ?Hematuria profile sent today ?Return to office in 3 months ? ? ?Chief Complaint: ?Chief Complaint  ?Patient presents with  ? Hematuria  ? ? ?HPI: ?Kristen Ortega is a 56 y.o. female who presents for continued evaluation of gross hematuria. ?She has a history of left renal cell carcinoma and bilateral nephrolithiasis.  ?She was previously followed in [facility] and was last seen in June 2022. ? 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  ?Lasix renogram showed split function of 47% on left and 53% on right, obstruction on left.  ?She underwent cystoscopy, bilateral retrograde pyelograms, right ureteroscopy with holmium laser lithotripsy, insertion of bilateral ureteral stents on 11/19/17. The right renal calculi were fragmented. ?KUB from 11/27/17 showed excellent fragmentation of the right renal calculi with fragments along the stent in the distal right ureter.  ?Her right stent was removed on 12/02/18.  ?She is s/p left partial nephrectomy and open pyelolithotomy on 12/30/17. 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 She did well since the procedure. Her left ureteral stent was removed on 02/06/18.  ?Labs from 6/19: ?Cr 0.80 ?CT from 6/19 showed no recurrent or metastatic disease, bilateral nephrolithiasis. ?Labs from 12/14/2018 showed a normal CBC. CMP showed normal renal function and normal LFTs. Her potassium was abnormal at 2.8. She was treated with potassium supplementation. A repeat CMP from 12/22/2018 showed a normal potassium of 4.2, normal renal function, and  normal LFTs. ?CT from 1/20 showed no recurrence, no metastatic disease, and bilateral nephrolithiasis. ?CXR from 1/20 showed no acute changes. ?CMP 1/21: Cr 0.9, normal LFT's ?CBC 1/21: Normal ?CT from 1/21 showed changes consistent with a left partial nephrectomy with no suggestion of recurrence or metastatic disease in the abdomen or pelvis, multiple nonobstructive renal calculi bilaterally. ?Chest x-ray from 1/21 showed no acute changes. ?CMP from 2/22: Creatinine 0.82, normal LFTs ?CBC from 2/22: Normal ? ?She underwent evaluation with a CT scan on 01/24/2021. The study showed no evidence of recurrent disease in the left kidney or evidence of metastatic disease. It did show a 8 mm right UPJ stone with some mild wall enhancement of the right proximal ureter, no evidence of obstruction, bilateral renal calculi. ?Urine culture from 01/24/2021: 50-100 K E. coli. She was started on Bactrim DS. ?Chest x-ray from 3/22 showed no acute abnormalities. ? ?She underwent right ESL on 02/06/21. ?She passed stone fragments following the procedure. ?KUB from 02/14/21 showed a 3 x 6 mm calcification lateral to the right L4 transverse process. ?KUB from 02/28/2021 showed bilateral renal calcifications and calcifications in the area of the right distal ureter measuring 4 x 5 mm. ?KUB from 03/16/2021 showed further distal migration of the calcifications in the right distal ureter. ?KUB imaging from 04/04/2021 showed persistent calcifications in the area of the right distal ureter unchanged from prior studies. ?She underwent cystoscopy, bilateral retrograde pyelograms, and right ureteroscopy on 04/23/2021. No stones were seen in the right ureter. ? ?At her visit in January 2023, she reported gross hematuria for the past 2-3 months.  She reported some intermittent bilateral flank pain.  Her urine has been brown in color, typically in the morning.  No dysuria.  Urine culture from 11/21/2021 showed no growth. ?CMP from 11/21/2021 showed normal renal  function and normal LFTs. ?CBC showed a white blood cell count of 10.8. ?Urinalysis from 12/25/2021 showed >30 RBCs and 6-10 WBCs.  Urine culture showed <10K mixed flora. ?CT hematuria protocol from 01/17/2022 demonstrated multiple small bilateral renal calculi greater on the left, unchanged postoperative appearance of the left kidney status post partial nephrectomy, no evidence of recurrence or metastatic disease, no hydronephrosis. ?She continued to have episodes of gross hematuria. She was evaluated with cystoscopy on 02/01/2022.  Patient was noted to have squamous metaplasia but no other bladder or urethral abnormalities.  Urine cytology showed atypical urothelial cells. ? ?She returns today for follow-up.  She reports 1-2 episodes of gross hematuria since her last visit 6 weeks ago.  The last episode occurred approximately 1 week ago.  She is not having any flank pain or dysuria. ? ?Portions of the above documentation were copied from a prior visit for review purposes only. ? ?Allergies: ?No Known Allergies ? ?PMH: ?Past Medical History:  ?Diagnosis Date  ? Cancer ([place]) 12/2017  ? kidney cancer  ? Hyperlipidemia   ? Hypertension   ? Vaginal Pap smear, abnormal   ? ? ?PSH: ?Past Surgical History:  ?Procedure Laterality Date  ? CESAREAN SECTION    ? x2  ? COLONOSCOPY  02/14/2020  ? COLPOSCOPY    ? KIDNEY SURGERY    ? kidney cancer removed 12-2017  ? WISDOM TOOTH EXTRACTION    ? ? ?SH: ?Social History  ? ?Tobacco Use  ? Smoking status: Former  ?  Years: 20.00  ?  Types: Cigarettes  ? Smokeless tobacco: Never  ?Substance Use Topics  ? Alcohol use: Yes  ?  Comment: occasional  ? Drug use: No  ? ? ?ROS: ?Constitutional:  Negative for fever, chills, weight loss ?CV: Negative for chest pain, previous MI, hypertension ?Respiratory:  Negative for shortness of breath, wheezing, sleep apnea, frequent cough ?GI:  Negative for nausea, vomiting, bloody stool, GERD ? ?PE: ?BP 124/73   Pulse 71  ?GENERAL APPEARANCE:  Well  appearing, well developed, well nourished, NAD ?HEENT:  Atraumatic, normocephalic, oropharynx clear ?NECK:  Supple without lymphadenopathy or thyromegaly ?ABDOMEN:  Soft, non-tender, no masses ?EXTREMITIES:  Moves all extremities well, without clubbing, cyanosis, or edema ?NEUROLOGIC:  Alert and oriented x 3, normal gait, CN II-XII grossly intact ?MENTAL STATUS:  appropriate ?BACK:  Non-tender to palpation, No CVAT ?SKIN:  Warm, dry, and intact ? ? ?Results: ?U/A:  3-10 RBC, 0-5 WBC few bacteria ? ? ?

## 2022-03-15 MED ORDER — ESCITALOPRAM OXALATE 20 MG PO TABS: 20.0000 mg | ORAL_TABLET | Freq: Every day | ORAL | 3 refills | Status: DC

## 2022-03-15 NOTE — Addendum Note (Signed)
Addended by: Lamar Blinks C on: 03/15/2022 05:03 AM ? ? Modules accepted: Orders ? ?

## 2022-03-27 ENCOUNTER — Other Ambulatory Visit: Payer: Self-pay

## 2022-06-14 ENCOUNTER — Ambulatory Visit: Payer: Commercial Managed Care - PPO | Admitting: Urology

## 2023-02-17 ENCOUNTER — Other Ambulatory Visit: Payer: Self-pay | Admitting: Family Medicine

## 2023-02-17 DIAGNOSIS — F321 Major depressive disorder, single episode, moderate: Secondary | ICD-10-CM

## 2023-02-22 IMAGING — MG MM DIGITAL SCREENING BILAT W/ TOMO AND CAD
6 of 12 series · 6 of 36 positions shown · non-contrast
Comparison: Previous exam(s).

ACR Breast Density Category a: The breast tissue is almost entirely
fatty.

CLINICAL DATA: Screening.

EXAM:
DIGITAL SCREENING BILATERAL MAMMOGRAM WITH TOMOSYNTHESIS AND CAD
TECHNIQUE: Bilateral screening digital craniocaudal and mediolateral oblique
mammograms were obtained. Bilateral screening digital breast
tomosynthesis was performed. The images were evaluated with
computer-aided detection.

[R CC synth-2D (1 of 2)]
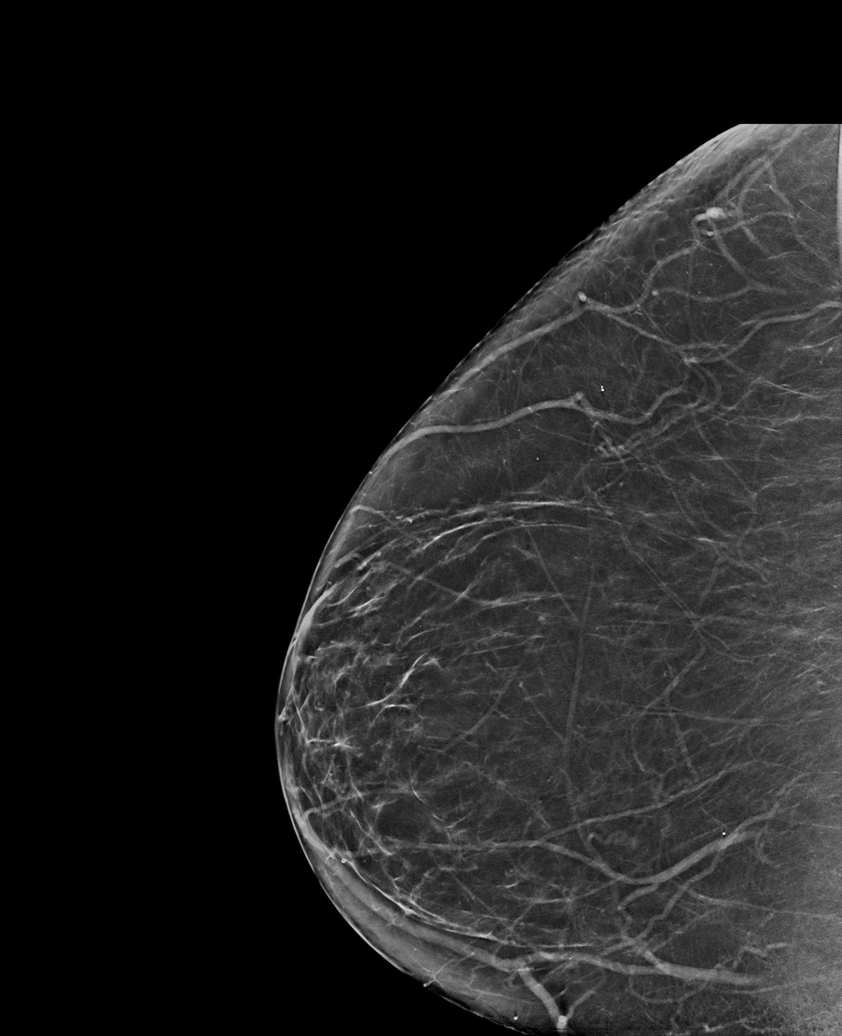

[R CC synth-2D (2 of 2)]
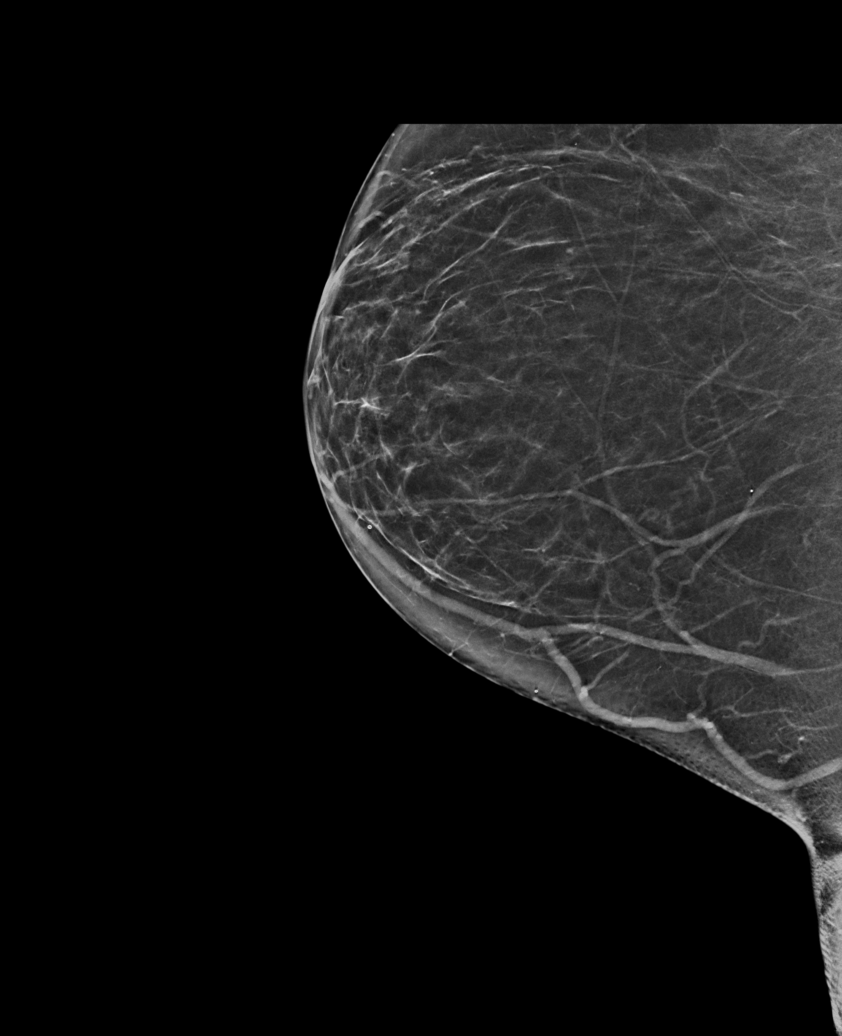

[L MLO synth-2D]
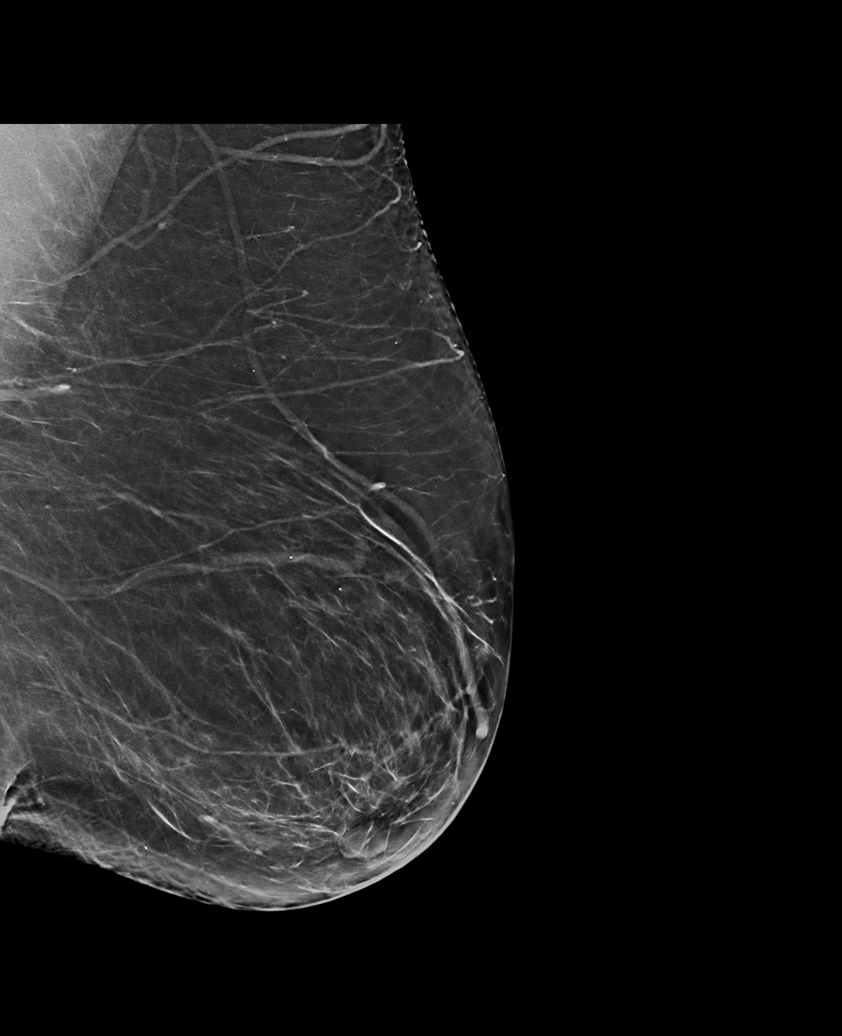

[L CC synth-2D (1 of 2)]
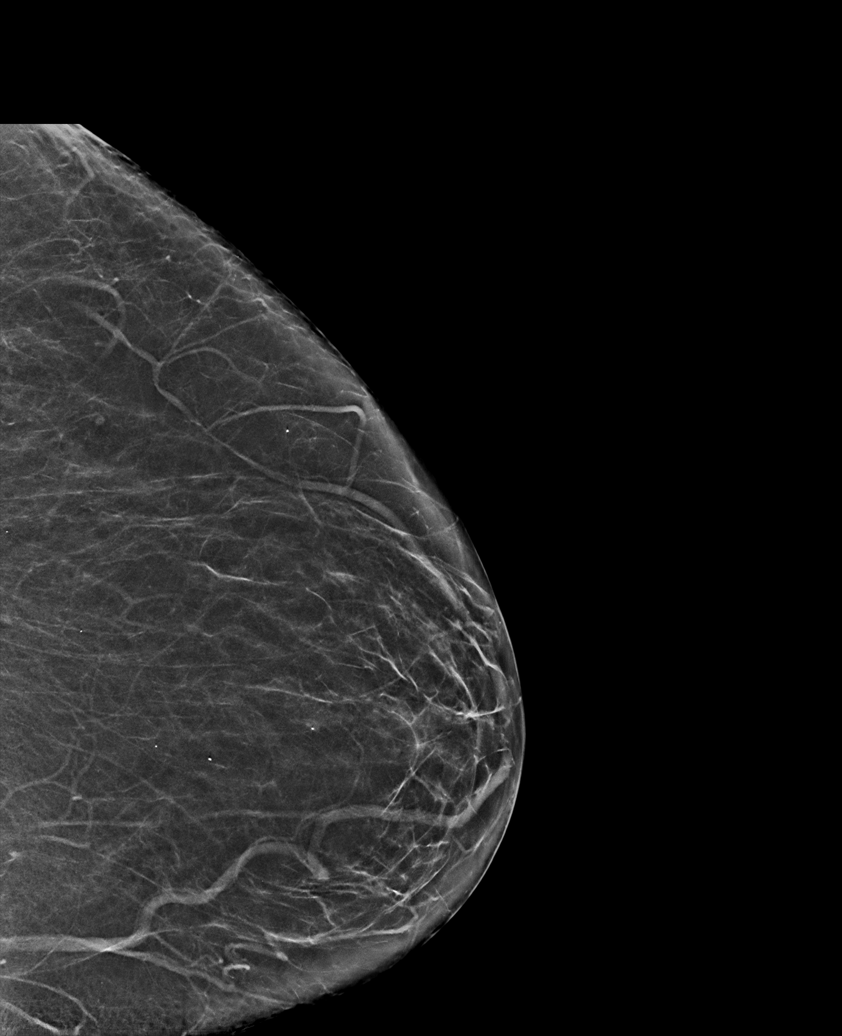

[R MLO synth-2D]
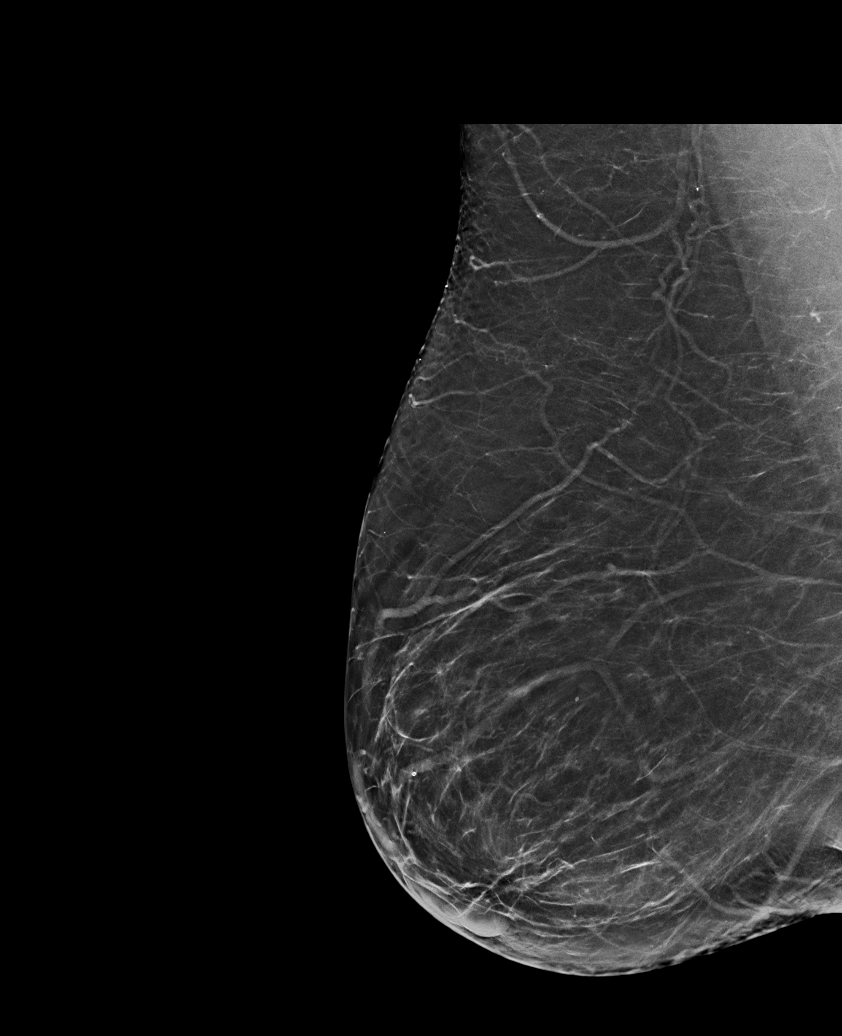

[L CC synth-2D (2 of 2)]
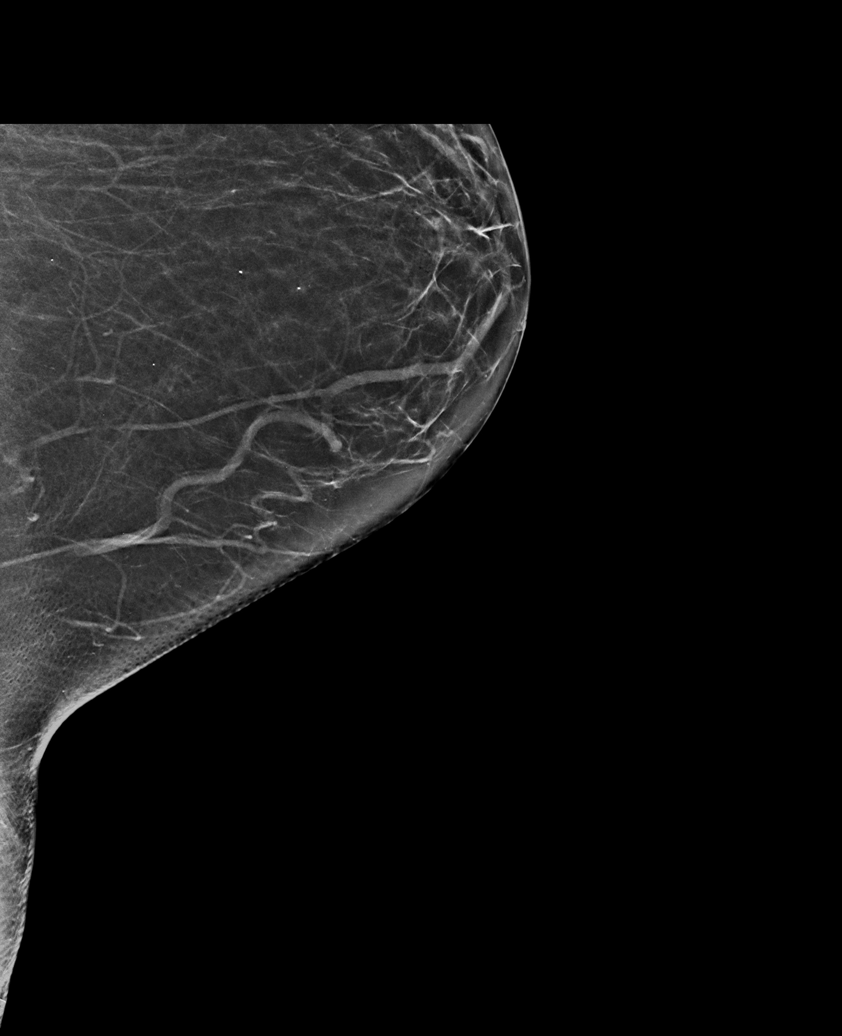

[6 of 36 positions shown; findings below may reference images not displayed]

FINDINGS: There are no findings suspicious for malignancy. The images were
evaluated with computer-aided detection.
IMPRESSION: No mammographic evidence of malignancy. A result letter of this
screening mammogram will be mailed directly to the patient.

RECOMMENDATION:
Screening mammogram in one year. (Code:JP-J-DD5)

BI-RADS CATEGORY  1: Negative.

## 2023-03-08 ENCOUNTER — Other Ambulatory Visit: Payer: Self-pay | Admitting: Family Medicine

## 2023-03-08 DIAGNOSIS — F321 Major depressive disorder, single episode, moderate: Secondary | ICD-10-CM

## 2023-03-13 ENCOUNTER — Telehealth: Payer: Self-pay | Admitting: Urology

## 2023-03-13 NOTE — Telephone Encounter (Signed)
Patient called wanting to schedule her yearly CT SCAN and Bloodwork with Dr Felipa Eth. I let patient know I don't schedule any kind of labs or CT SCAN but I would be happy to schedule an office visit with patient. Patient declined in wanting to schedule an appointment, without being scheduled for a CT SCAN and labs.  Patients callback #: [phone]

## 2023-03-18 ENCOUNTER — Other Ambulatory Visit: Payer: Self-pay | Admitting: Urology

## 2023-03-18 DIAGNOSIS — C642 Malignant neoplasm of left kidney, except renal pelvis: Secondary | ICD-10-CM

## 2023-03-24 ENCOUNTER — Other Ambulatory Visit: Payer: Self-pay

## 2023-03-24 DIAGNOSIS — C642 Malignant neoplasm of left kidney, except renal pelvis: Secondary | ICD-10-CM

## 2023-03-24 NOTE — Telephone Encounter (Signed)
Pt scheduled for CT on 4/17. Pt advised to come upstairs when CT is complete to have her labs drawn. Follow up appt made for 04/08/23. Pt confirmed all appts and expressed understanding.

## 2023-03-31 ENCOUNTER — Encounter: Payer: Commercial Managed Care - PPO | Admitting: Family Medicine

## 2023-03-31 NOTE — Progress Notes (Addendum)
[facility] [address], [address] [place], [place] [address] [phone]  Date:  04/03/2023   Name:  Kristen Ortega   DOB:  May 12, 1966   MRN:  [id number]  PCP:  Kristen Cables, MD    Chief Complaint: Annual Exam (Concerns/ questions: none/Mammogram: none recent d/t ins lapse in April 23/Pap: see above/Tdap: see above/Shingrix: see above)   History of Present Illness:  Kristen Ortega is a 57 y.o. very pleasant female patient who presents with the following:  Patient seen today for physical exam- history of hypertension, left renal cell carcinoma status post partial nephrectomy, significant degenerative disc disease with resultant back pain, taking tramadol  Most recent visit with myself was in December 2022 She continues to follow-up regularly with her urologist-Dr. Rosilyn Ortega  When she contacted me about this time last year she was doing well on Lexapro and needed a refill  We note she lost her health insurance for a while but now has it back again  She has not taken her amlodipine in a year - BP looks good however on no medication She has checked it a few times at home and it has looked good then as well  Tetanus booster- boost today  Shingrix- start today  Recommend COVID booster Pap smear needs to be updated - she had + HPV in 2021 per GYN -she is okay to update Pap today Mammogram- ordered today   Debbie notes history of chronic back pain, she wonders about going on a pain regimen such as fentanyl patches.  I have prescribed tramadol for her but nothing stronger.  Advised that prior to starting something such as fentanyl patches we would need to do further evaluation of her back pain including x-rays, and likely a neurosurgical consultation to see if any other treatments are available.  She states understanding and will think about this She notes she did have a CT abdomen pelvis yesterday per her urologist, she  wonders if this may provide any information about her spine.  Certainly I can look at this when the report comes in  Patient Active Problem List   Diagnosis Date Noted   Gross hematuria 02/01/2022   Nephrolithiasis 12/25/2021   Chronic back pain 03/28/2020   Prediabetes 01/31/2020   Dyslipidemia 01/31/2020   Renal cell carcinoma 01/29/2020   Benign essential HTN 09/17/2017   Scoliosis of thoracolumbar spine 09/17/2017    Past Medical History:  Diagnosis Date   Cancer 12/2017   kidney cancer   Hyperlipidemia    Hypertension    Vaginal Pap smear, abnormal     Past Surgical History:  Procedure Laterality Date   CESAREAN SECTION     x2   COLONOSCOPY  02/14/2020   COLPOSCOPY     KIDNEY SURGERY     kidney cancer removed 12-2017   WISDOM TOOTH EXTRACTION      Social History   Tobacco Use   Smoking status: Former    Years: 20    Types: Cigarettes   Smokeless tobacco: Never  Substance Use Topics   Alcohol use: Yes    Comment: occasional   Drug use: No    Family History  Problem Relation Age of Onset   Stroke Paternal Grandmother    Hypertension Father    Cancer Mother        lymphoma   Diabetes Neg Hx    Colon cancer Neg Hx    Colon polyps Neg Hx  Esophageal cancer Neg Hx    Rectal cancer Neg Hx    Stomach cancer Neg Hx     No Known Allergies  Medication list has been reviewed and updated.  Current Outpatient Medications on File Prior to Visit  Medication Sig Dispense Refill   calcium carbonate (TUMS - DOSED IN MG ELEMENTAL CALCIUM) 500 MG chewable tablet Chew 1 tablet by mouth daily.     Omega-3 Fatty Acids (FISH OIL) 500 MG CAPS Take by mouth.     acetaminophen (TYLENOL) 325 MG tablet Take 500 mg by mouth every 6 (six) hours as needed. (Patient not taking: Reported on 04/03/2023)     diphenhydrAMINE (BENADRYL) 50 MG tablet Take 50 mg by mouth at bedtime as needed for itching.     No current facility-administered medications on file prior to visit.     Review of Systems:  As per HPI- otherwise negative.   Physical Examination: Vitals:   04/03/23 1040  BP: 122/78  Pulse: 71  Resp: 18  Temp: 98.1 F (36.7 C)  SpO2: 97%   Vitals:   04/03/23 1040  Weight: 213 lb 3.2 oz (96.7 kg)  Height:  (1.626 m)   Body mass index is 36.6 kg/m. Ideal Body Weight: Weight in (lb) to have BMI = 25: 145.3  GEN: no acute distress.  Obese, looks well HEENT: Atraumatic, Normocephalic.  Bilateral TM wnl, oropharynx normal.  PEERL,EOMI.   Ears and Nose: No external deformity. CV: RRR, No M/G/R. No JVD. No thrill. No extra heart sounds. PULM: CTA B, no wheezes, crackles, rhonchi. No retractions. No resp. distress. No accessory muscle use. ABD: S, NT, ND. No rebound. No HSM. EXTR: No c/c/e PSYCH: Normally interactive. Conversant.  All pelvic exam today  Assessment and Plan: Physical exam  Vitamin D deficiency - Plan: VITAMIN D 25 Hydroxy (Vit-D Deficiency, Fractures)  Screening for diabetes mellitus - Plan: Hemoglobin A1c  Screening for deficiency anemia  Chronic bilateral thoracic back pain - Plan: cyclobenzaprine (FLEXERIL) 10 MG tablet  Screening for thyroid disorder - Plan: TSH  Screening for hyperlipidemia - Plan: Lipid panel  Current moderate episode of major depressive disorder, unspecified whether recurrent - Plan: escitalopram (LEXAPRO) 20 MG tablet  Rash and nonspecific skin eruption - Plan: nystatin-triamcinolone (MYCOLOG II) cream  Dyslipidemia - Plan: rosuvastatin (CRESTOR) 20 MG tablet  Chronic cough - Plan: albuterol (VENTOLIN HFA) 108 (90 Base) MCG/ACT inhaler  Encounter for screening mammogram for malignant neoplasm of breast - Plan: MM 3D SCREENING MAMMOGRAM BILATERAL BREAST  Immunization due - Plan: Zoster Recombinant (Shingrix ), Tdap vaccine greater than or equal to 7yo IM  Screening for cervical cancer - Plan: Cytology - PAP( [facility])  Physical exam today.  Encouraged healthy diet and exercise  routine Will plan further follow- up pending labs. Gave Tdap, first dose of shingles today  it was good to see again today, I will be in touch with your labs as soon as possible Your blood pressure looks fine on no medication, we can continue to not use blood pressure medicine for now Other refills are sent to your [facility]-let me know if cost is a concern with your Crestor, in that case we can use an alternative medicine or try different pharmacy  You got your tetanus shot and first dose of shingles today Second dose of shingles is due in 2 to 6 months, this can be done as a nurse visit only  Regarding your back pain, we can certainly look at getting you  on a stronger pain medicine such as fentanyl patches.  However, we would need to get more information first.  What I would need to do is update imaging of your back and also get a neurosurgery consultation to see if any other treatments may be available  We may get good information about your back from the CT you had yesterday, let me know when the report comes in I am glad to look at it  Mammogram is ordered, please stop by imaging on the ground floor today to schedule  Signed Abbe Amsterdam, MD Addendum 4/19 Received labs as below, message to patient  Results for orders placed or performed in visit on 04/03/23  Hemoglobin A1c  Result Value Ref Range   Hgb A1c MFr Bld 5.7 4.6 - 6.5 %  Lipid panel  Result Value Ref Range   Cholesterol 211 (H) 0 - 200 mg/dL   Triglycerides 161.0 (H) 0.0 - 149.0 mg/dL   HDL 96.04 >54.09 mg/dL   VLDL 81.1 0.0 - 91.4 mg/dL   LDL Cholesterol 782 (H) 0 - 99 mg/dL   Total CHOL/HDL Ratio 5    NonHDL 168.18   TSH  Result Value Ref Range   TSH 1.21 0.35 - 5.50 uIU/mL  VITAMIN D 25 Hydroxy (Vit-D Deficiency, Fractures)  Result Value Ref Range   VITD 17.75 (L) 30.00 - 100.00 ng/mL

## 2023-03-31 NOTE — Patient Instructions (Incomplete)
It was good to see again today, I will be in touch with your labs as soon as possible Your blood pressure looks fine on no medication, we can continue to not use blood pressure medicine for now Other refills are sent to your [facility]-let me know if cost is a concern with your Crestor, in that case we can use an alternative medicine or try different pharmacy  You got your tetanus shot and first dose of shingles today Second dose of shingles is due in 2 to 6 months, this can be done as a nurse visit only  Regarding your back pain, we can certainly look at getting you on a stronger pain medicine such as fentanyl patches.  However, we would need to get more information first.  What I would need to do is update imaging of your back and also get a neurosurgery consultation to see if any other treatments may be available  We may get good information about your back from the CT you had yesterday, let me know when the report comes in I am glad to look at it  Mammogram is ordered, please stop by imaging on the ground floor today to schedule

## 2023-04-02 ENCOUNTER — Other Ambulatory Visit: Payer: Self-pay

## 2023-04-02 ENCOUNTER — Encounter (HOSPITAL_BASED_OUTPATIENT_CLINIC_OR_DEPARTMENT_OTHER): Payer: Self-pay

## 2023-04-02 ENCOUNTER — Ambulatory Visit (HOSPITAL_BASED_OUTPATIENT_CLINIC_OR_DEPARTMENT_OTHER)
Admission: RE | Admit: 2023-04-02 | Discharge: 2023-04-02 | Disposition: A | Discharge status: Home / Self Care | Payer: Commercial Managed Care - PPO | Source: Ambulatory Visit | Attending: Urology | Admitting: Urology

## 2023-04-02 DIAGNOSIS — C642 Malignant neoplasm of left kidney, except renal pelvis: Secondary | ICD-10-CM | POA: Diagnosis present

## 2023-04-02 LAB — CBC
Hematocrit: 43.6 % (ref 34.0–46.6)
Hemoglobin: 14.1 g/dL (ref 11.1–15.9)
MCH: 27.7 pg (ref 26.6–33.0)
MCHC: 32.3 g/dL (ref 31.5–35.7)
MCV: 86 fL (ref 79–97)
Platelets: 159 10*3/uL (ref 150–450)
RBC: 5.09 x10E6/uL (ref 3.77–5.28)
RDW: 13.3 % (ref 11.7–15.4)
WBC: 9.3 10*3/uL (ref 3.4–10.8)

## 2023-04-02 LAB — POCT I-STAT CREATININE: Creatinine, Ser: 0.9 mg/dL (ref 0.44–1.00)

## 2023-04-02 MED ORDER — IOHEXOL 300 MG/ML  SOLN
100.0000 mL | Freq: Once | INTRAMUSCULAR | Status: AC | PRN
  Administered 2023-04-02: 100 mL via INTRAVENOUS

## 2023-04-03 ENCOUNTER — Ambulatory Visit (INDEPENDENT_AMBULATORY_CARE_PROVIDER_SITE_OTHER): Payer: Commercial Managed Care - PPO | Admitting: Family Medicine

## 2023-04-03 ENCOUNTER — Other Ambulatory Visit (HOSPITAL_COMMUNITY)
Admission: RE | Admit: 2023-04-03 | Discharge: 2023-04-03 | Disposition: A | Discharge status: Home / Self Care | Payer: Commercial Managed Care - PPO | Source: Ambulatory Visit | Attending: Family Medicine | Admitting: Family Medicine

## 2023-04-03 VITALS — BP 122/78 | HR 71 | Temp 98.1°F | Resp 18 | Ht 64.0 in | Wt 213.2 lb

## 2023-04-03 DIAGNOSIS — E785 Hyperlipidemia, unspecified: Secondary | ICD-10-CM

## 2023-04-03 DIAGNOSIS — Z124 Encounter for screening for malignant neoplasm of cervix: Secondary | ICD-10-CM

## 2023-04-03 DIAGNOSIS — E559 Vitamin D deficiency, unspecified: Secondary | ICD-10-CM

## 2023-04-03 DIAGNOSIS — R21 Rash and other nonspecific skin eruption: Secondary | ICD-10-CM

## 2023-04-03 DIAGNOSIS — M546 Pain in thoracic spine: Secondary | ICD-10-CM

## 2023-04-03 DIAGNOSIS — Z0000 Encounter for general adult medical examination without abnormal findings: Secondary | ICD-10-CM

## 2023-04-03 DIAGNOSIS — R053 Chronic cough: Secondary | ICD-10-CM

## 2023-04-03 DIAGNOSIS — F321 Major depressive disorder, single episode, moderate: Secondary | ICD-10-CM

## 2023-04-03 DIAGNOSIS — Z130 Encounter for screening for diseases of the blood and blood-forming organs and certain disorders involving the immune mechanism: Secondary | ICD-10-CM

## 2023-04-03 DIAGNOSIS — Z13220 Encounter for screening for lipoid disorders: Secondary | ICD-10-CM

## 2023-04-03 DIAGNOSIS — Z1231 Encounter for screening mammogram for malignant neoplasm of breast: Secondary | ICD-10-CM

## 2023-04-03 DIAGNOSIS — Z131 Encounter for screening for diabetes mellitus: Secondary | ICD-10-CM | POA: Diagnosis not present

## 2023-04-03 DIAGNOSIS — G8929 Other chronic pain: Secondary | ICD-10-CM

## 2023-04-03 DIAGNOSIS — Z23 Encounter for immunization: Secondary | ICD-10-CM

## 2023-04-03 DIAGNOSIS — Z1329 Encounter for screening for other suspected endocrine disorder: Secondary | ICD-10-CM | POA: Diagnosis not present

## 2023-04-03 LAB — COMPREHENSIVE METABOLIC PANEL
ALT: 14 IU/L (ref 0–32)
AST: 19 IU/L (ref 0–40)
Albumin/Globulin Ratio: 1.3 (ref 1.2–2.2)
Albumin: 4 g/dL (ref 3.8–4.9)
Alkaline Phosphatase: 95 IU/L (ref 44–121)
BUN/Creatinine Ratio: 26 — ABNORMAL HIGH (ref 9–23)
BUN: 23 mg/dL (ref 6–24)
Bilirubin Total: 0.3 mg/dL (ref 0.0–1.2)
CO2: 26 mmol/L (ref 20–29)
Calcium: 9.1 mg/dL (ref 8.7–10.2)
Chloride: 100 mmol/L (ref 96–106)
Creatinine, Ser: 0.9 mg/dL (ref 0.57–1.00)
Globulin, Total: 3 g/dL (ref 1.5–4.5)
Glucose: 84 mg/dL (ref 70–99)
Potassium: 4.4 mmol/L (ref 3.5–5.2)
Sodium: 139 mmol/L (ref 134–144)
Total Protein: 7 g/dL (ref 6.0–8.5)
eGFR: 75 mL/min/{1.73_m2} (ref >59)

## 2023-04-03 LAB — LIPID PANEL
Cholesterol: 211 mg/dL — ABNORMAL HIGH (ref 0–200)
HDL: 43 mg/dL (ref >39.00)
LDL Cholesterol: 136 mg/dL — ABNORMAL HIGH (ref 0–99)
NonHDL: 168.18
Total CHOL/HDL Ratio: 5
Triglycerides: 161 mg/dL — ABNORMAL HIGH (ref 0.0–149.0)
VLDL: 32.2 mg/dL (ref 0.0–40.0)

## 2023-04-03 LAB — HEMOGLOBIN A1C: Hgb A1c MFr Bld: 5.7 % (ref 4.6–6.5)

## 2023-04-03 LAB — TSH: TSH: 1.21 u[IU]/mL (ref 0.35–5.50)

## 2023-04-03 LAB — VITAMIN D 25 HYDROXY (VIT D DEFICIENCY, FRACTURES): VITD: 17.75 ng/mL — ABNORMAL LOW (ref 30.00–100.00)

## 2023-04-03 MED ORDER — CYCLOBENZAPRINE HCL 10 MG PO TABS
5.0000 mg | ORAL_TABLET | Freq: Two times a day (BID) | ORAL | 1 refills | Status: DC | PRN
Start: 2023-04-03 — End: 2024-04-13

## 2023-04-03 MED ORDER — ESCITALOPRAM OXALATE 20 MG PO TABS: 20.0000 mg | ORAL_TABLET | Freq: Every day | ORAL | 3 refills | Status: DC

## 2023-04-03 MED ORDER — NYSTATIN-TRIAMCINOLONE 100000-0.1 UNIT/GM-% EX CREA
1.0000 | TOPICAL_CREAM | Freq: Two times a day (BID) | CUTANEOUS | 2 refills | Status: AC
Start: 2023-04-03 — End: ?

## 2023-04-03 MED ORDER — ROSUVASTATIN CALCIUM 20 MG PO TABS
10.0000 mg | ORAL_TABLET | Freq: Every day | ORAL | 3 refills | Status: DC
Start: 2023-04-03 — End: 2024-03-12

## 2023-04-03 MED ORDER — ALBUTEROL SULFATE HFA 108 (90 BASE) MCG/ACT IN AERS
2.0000 | INHALATION_SPRAY | Freq: Four times a day (QID) | RESPIRATORY_TRACT | 6 refills | Status: DC | PRN
Start: 2023-04-03 — End: 2024-06-21

## 2023-04-04 ENCOUNTER — Encounter: Payer: Self-pay | Admitting: Family Medicine

## 2023-04-04 MED ORDER — VITAMIN D3 1.25 MG (50000 UT) PO CAPS
ORAL_CAPSULE | ORAL | 0 refills | Status: DC
Start: 2023-04-04 — End: 2024-04-13

## 2023-04-04 NOTE — Addendum Note (Signed)
Addended by: Abbe Amsterdam C on: 04/04/2023 07:52 AM   Modules accepted: Orders

## 2023-04-08 ENCOUNTER — Ambulatory Visit (INDEPENDENT_AMBULATORY_CARE_PROVIDER_SITE_OTHER): Payer: Commercial Managed Care - PPO | Admitting: Urology

## 2023-04-08 ENCOUNTER — Encounter: Payer: Self-pay | Admitting: Urology

## 2023-04-08 ENCOUNTER — Ambulatory Visit (HOSPITAL_BASED_OUTPATIENT_CLINIC_OR_DEPARTMENT_OTHER)
Admission: RE | Admit: 2023-04-08 | Discharge: 2023-04-08 | Disposition: A | Discharge status: Home / Self Care | Payer: Commercial Managed Care - PPO | Source: Ambulatory Visit | Attending: Urology | Admitting: Urology

## 2023-04-08 ENCOUNTER — Encounter: Payer: Self-pay | Admitting: Family Medicine

## 2023-04-08 VITALS — BP 135/83 | HR 69 | Ht 64.0 in | Wt 211.0 lb

## 2023-04-08 DIAGNOSIS — N200 Calculus of kidney: Secondary | ICD-10-CM

## 2023-04-08 DIAGNOSIS — R8290 Unspecified abnormal findings in urine: Secondary | ICD-10-CM

## 2023-04-08 DIAGNOSIS — Z85528 Personal history of other malignant neoplasm of kidney: Secondary | ICD-10-CM

## 2023-04-08 DIAGNOSIS — C642 Malignant neoplasm of left kidney, except renal pelvis: Secondary | ICD-10-CM | POA: Insufficient documentation

## 2023-04-08 DIAGNOSIS — M546 Pain in thoracic spine: Secondary | ICD-10-CM

## 2023-04-08 DIAGNOSIS — M545 Low back pain, unspecified: Secondary | ICD-10-CM

## 2023-04-08 LAB — CYTOLOGY - PAP
Adequacy: ABSENT
Comment: NEGATIVE
Diagnosis: NEGATIVE
High risk HPV: NEGATIVE

## 2023-04-08 LAB — URINALYSIS, ROUTINE W REFLEX MICROSCOPIC
Bilirubin, UA: NEGATIVE
Glucose, UA: NEGATIVE
Ketones, UA: NEGATIVE
Nitrite, UA: NEGATIVE
Specific Gravity, UA: 1.02 (ref 1.005–1.030)
Urobilinogen, Ur: 0.2 mg/dL (ref 0.2–1.0)
pH, UA: 6.5 (ref 5.0–7.5)

## 2023-04-08 LAB — MICROSCOPIC EXAMINATION
Cast Type: NONE SEEN
Casts: NONE SEEN /lpf
Crystal Type: NONE SEEN
Crystals: NONE SEEN
Trichomonas, UA: NONE SEEN
WBC, UA: >30 /hpf — AB (ref 0–5)
Yeast, UA: NONE SEEN

## 2023-04-08 NOTE — Progress Notes (Signed)
Assessment: 1. Renal cell carcinoma of left kidney;  clear cell carcinoma, T1bNxMx; s/p left partial nephrectomy 1/19   2. Nephrolithiasis   3. Abnormal urine findings    Plan: I personally viewed the CT study from 04/04/2023 with results as noted below. I reviewed the patient's recent laboratory results. CXR today Urine culture sent today - will call with results Return office in 1 year Given AUA guidelines, she will not require imaging on an annual basis  Chief Complaint: Chief Complaint  Patient presents with   Renal cell carcinoma   HPI: Kristen Ortega is a 58 y.o. female who presents for continued evaluation of history of renal cell carcinoma of left kidney, nephrolithiasis, and history of gross hematuria.  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  Lasix renogram showed split function of 47% on left and 53% on right, obstruction on left.  She underwent cystoscopy, bilateral retrograde pyelograms, right ureteroscopy with holmium laser lithotripsy, insertion of bilateral ureteral stents on 11/19/17. The right renal calculi were fragmented. KUB from 11/27/17 showed excellent fragmentation of the right renal calculi with fragments along the stent in the distal right ureter.  Her right stent was removed on 12/02/18.  She is s/p left partial nephrectomy and open pyelolithotomy on 12/30/17. 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 She did well since the procedure. Her left ureteral stent was removed on 02/06/18.  Labs from 6/19: Cr 0.80 CT from 6/19 showed no recurrent or metastatic disease, bilateral nephrolithiasis. Labs from 12/14/2018 showed a normal CBC. CMP showed normal renal function and normal LFTs. Her potassium was  abnormal at 2.8. She was treated with potassium supplementation. A repeat CMP from 12/22/2018 showed a normal potassium of 4.2, normal renal function, and normal LFTs. CT from 1/20 showed no recurrence, no metastatic disease, and bilateral nephrolithiasis. CXR from 1/20 showed no acute changes. CMP 1/21: Cr 0.9, normal LFT's CBC 1/21: Normal CT from 1/21 showed changes consistent with a left partial nephrectomy with no suggestion of recurrence or metastatic disease in the abdomen or pelvis, multiple nonobstructive renal calculi bilaterally. Chest x-ray from 1/21 showed no acute changes. CMP from 2/22: Creatinine 0.82, normal LFTs CBC from 2/22: Normal  She underwent evaluation with a CT scan on 01/24/2021. The study showed no evidence of recurrent disease in the left kidney or evidence of metastatic disease. It did show a 8 mm right UPJ stone with some mild wall enhancement of the right proximal ureter, no evidence of obstruction, bilateral renal calculi. Urine culture from 01/24/2021: 50-100 K E. coli. She was started on Bactrim DS. Chest x-ray from 3/22 showed no acute abnormalities.  She underwent right ESL on 02/06/21. She passed stone fragments following the procedure. KUB from 02/14/21 showed a 3 x 6 mm calcification lateral to the right L4 transverse process. KUB from 02/28/2021 showed bilateral renal calcifications and calcifications in the area of the right distal ureter measuring 4 x 5 mm. KUB from 03/16/2021 showed further distal migration of the calcifications in the right distal ureter. KUB imaging from 04/04/2021 showed persistent calcifications in the area of the right distal ureter unchanged from prior studies. She underwent cystoscopy, bilateral retrograde pyelograms, and right ureteroscopy on 04/23/2021. No stones were seen in the right ureter.  At her  visit in January 2023, she reported gross hematuria for the past 2-3 months.  She reported some intermittent bilateral flank pain.  Her urine  had been brown in color, typically in the morning.  No dysuria.  Urine culture from 11/21/2021 showed no growth. CMP from 11/21/2021 showed normal renal function and normal LFTs. CBC showed a white blood cell count of 10.8. Urinalysis from 12/25/2021 showed >30 RBCs and 6-10 WBCs.  Urine culture showed <10K mixed flora. CT hematuria protocol from 01/17/2022 demonstrated multiple small bilateral renal calculi greater on the left, unchanged postoperative appearance of the left kidney status post partial nephrectomy, no evidence of recurrence or metastatic disease, no hydronephrosis. She continued to have episodes of gross hematuria. She was evaluated with cystoscopy on 02/01/2022.  Patient was noted to have squamous metaplasia but no other bladder or urethral abnormalities.  Urine cytology showed atypical urothelial cells.  She returns today for follow-up.  No recent episodes of gross hematuria.  No dysuria or flank pain.  No weight loss or bone pain. CT abdomen and pelvis with and without contrast from 04/04/2023 showed bilateral nephrolithiasis, no evidence of obstruction, no evidence of recurrent or metastatic renal cell carcinoma. CMP and CBC were normal.  Portions of the above documentation were copied from a prior visit for review purposes only.  Allergies: No Known Allergies  PMH: Past Medical History:  Diagnosis Date   Cancer 12/2017   kidney cancer   Hyperlipidemia    Hypertension    Vaginal Pap smear, abnormal     PSH: Past Surgical History:  Procedure Laterality Date   CESAREAN SECTION     x2   COLONOSCOPY  02/14/2020   COLPOSCOPY     KIDNEY SURGERY     kidney cancer removed 12-2017   WISDOM TOOTH EXTRACTION      SH: Social History   Tobacco Use   Smoking status: Former    Years: 20    Types: Cigarettes   Smokeless tobacco: Never  Substance Use Topics   Alcohol use: Yes    Comment: occasional   Drug use: No    ROS: Constitutional:  Negative for fever, chills,  weight loss CV: Negative for chest pain, previous MI, hypertension Respiratory:  Negative for shortness of breath, wheezing, sleep apnea, frequent cough GI:  Negative for nausea, vomiting, bloody stool, GERD  PE: BP 135/83   Pulse 69   Ht  (1.626 m)   Wt 211 lb (95.7 kg)   BMI 36.22 kg/m  GENERAL APPEARANCE:  Well appearing, well developed, well nourished, NAD HEENT:  Atraumatic, normocephalic, oropharynx clear NECK:  Supple without lymphadenopathy or thyromegaly LUNGS:  Clear bilaterally CV:  RRR ABDOMEN:  Soft, non-tender, no masses EXTREMITIES:  Moves all extremities well, without clubbing, cyanosis, or edema NEUROLOGIC:  Alert and oriented x 3, normal gait, CN II-XII grossly intact MENTAL STATUS:  appropriate BACK:  Non-tender to palpation, No CVAT SKIN:  Warm, dry, and intact   Results: U/A: >30 WBC, 3-11 RBC, mod bacteria

## 2023-04-09 LAB — URINE CULTURE

## 2023-04-11 ENCOUNTER — Telehealth: Payer: Self-pay

## 2023-04-11 NOTE — Telephone Encounter (Signed)
Notified pt as advised, pt expressed understanding.  ?

## 2023-04-11 NOTE — Telephone Encounter (Signed)
-----   Message from Milderd Meager, MD sent at 04/10/2023 12:58 PM EDT ----- Please notify patient that her urine culture did not show evidence of a UTI.   Keep follow-up as scheduled.

## 2023-04-14 ENCOUNTER — Encounter: Payer: Self-pay | Admitting: Urology

## 2023-04-28 ENCOUNTER — Encounter: Payer: Self-pay | Admitting: Urology

## 2023-04-28 ENCOUNTER — Encounter: Payer: Self-pay | Admitting: Family Medicine

## 2023-04-28 ENCOUNTER — Ambulatory Visit (HOSPITAL_BASED_OUTPATIENT_CLINIC_OR_DEPARTMENT_OTHER)
Admission: RE | Admit: 2023-04-28 | Discharge: 2023-04-28 | Disposition: A | Discharge status: Home / Self Care | Payer: Commercial Managed Care - PPO | Source: Ambulatory Visit | Attending: Family Medicine | Admitting: Family Medicine

## 2023-04-28 ENCOUNTER — Encounter (HOSPITAL_BASED_OUTPATIENT_CLINIC_OR_DEPARTMENT_OTHER): Payer: Self-pay

## 2023-04-28 DIAGNOSIS — M546 Pain in thoracic spine: Secondary | ICD-10-CM | POA: Insufficient documentation

## 2023-04-28 DIAGNOSIS — M545 Low back pain, unspecified: Secondary | ICD-10-CM

## 2023-04-28 DIAGNOSIS — Z1231 Encounter for screening mammogram for malignant neoplasm of breast: Secondary | ICD-10-CM | POA: Diagnosis present

## 2023-05-02 ENCOUNTER — Encounter: Payer: Self-pay | Admitting: Family Medicine

## 2023-07-04 ENCOUNTER — Ambulatory Visit: Payer: Commercial Managed Care - PPO

## 2023-07-08 ENCOUNTER — Ambulatory Visit (INDEPENDENT_AMBULATORY_CARE_PROVIDER_SITE_OTHER): Payer: Commercial Managed Care - PPO

## 2023-07-08 DIAGNOSIS — Z23 Encounter for immunization: Secondary | ICD-10-CM | POA: Diagnosis not present

## 2023-07-08 NOTE — Progress Notes (Signed)
Kristen Ortega is a 57 y.o. female presents to the office today for Shingrix #2 injection, per physician's orders. Original order: 04/03/23 Shingrix 0.5 mL  was administered L Deltoid today.  Patient tolerated injection. Patient due for follow up labs/provider appt: No. Patient next injection due: n/a  DOD: Almyra Brace, Feliberto Harts

## 2023-09-18 ENCOUNTER — Encounter: Payer: Self-pay | Admitting: Family Medicine

## 2024-03-12 ENCOUNTER — Other Ambulatory Visit: Payer: Self-pay | Admitting: Family Medicine

## 2024-03-12 DIAGNOSIS — F321 Major depressive disorder, single episode, moderate: Secondary | ICD-10-CM

## 2024-03-12 DIAGNOSIS — E785 Hyperlipidemia, unspecified: Secondary | ICD-10-CM

## 2024-04-06 ENCOUNTER — Ambulatory Visit: Payer: Commercial Managed Care - PPO | Admitting: Urology

## 2024-04-10 ENCOUNTER — Other Ambulatory Visit: Payer: Self-pay | Admitting: Family Medicine

## 2024-04-10 DIAGNOSIS — F321 Major depressive disorder, single episode, moderate: Secondary | ICD-10-CM

## 2024-04-10 DIAGNOSIS — E785 Hyperlipidemia, unspecified: Secondary | ICD-10-CM

## 2024-04-12 ENCOUNTER — Ambulatory Visit: Payer: Commercial Managed Care - PPO | Admitting: Urology

## 2024-04-13 ENCOUNTER — Encounter: Payer: Self-pay | Admitting: Urology

## 2024-04-13 ENCOUNTER — Ambulatory Visit (INDEPENDENT_AMBULATORY_CARE_PROVIDER_SITE_OTHER): Admitting: Urology

## 2024-04-13 VITALS — BP 150/89 | HR 60 | Ht 64.0 in | Wt 211.0 lb

## 2024-04-13 DIAGNOSIS — Z09 Encounter for follow-up examination after completed treatment for conditions other than malignant neoplasm: Secondary | ICD-10-CM | POA: Diagnosis not present

## 2024-04-13 DIAGNOSIS — N200 Calculus of kidney: Secondary | ICD-10-CM

## 2024-04-13 DIAGNOSIS — Z85528 Personal history of other malignant neoplasm of kidney: Secondary | ICD-10-CM

## 2024-04-13 DIAGNOSIS — C642 Malignant neoplasm of left kidney, except renal pelvis: Secondary | ICD-10-CM

## 2024-04-13 DIAGNOSIS — Z87442 Personal history of urinary calculi: Secondary | ICD-10-CM | POA: Diagnosis not present

## 2024-04-13 LAB — URINALYSIS, ROUTINE W REFLEX MICROSCOPIC
Bilirubin, UA: NEGATIVE
Glucose, UA: NEGATIVE
Ketones, UA: NEGATIVE
Nitrite, UA: NEGATIVE
Specific Gravity, UA: 1.015 (ref 1.005–1.030)
Urobilinogen, Ur: 0.2 mg/dL (ref 0.2–1.0)
pH, UA: 6 (ref 5.0–7.5)

## 2024-04-13 LAB — MICROSCOPIC EXAMINATION

## 2024-04-13 NOTE — Progress Notes (Signed)
 Assessment: 1. Renal cell carcinoma of left kidney;  clear cell carcinoma, T1bNxMx; s/p left partial nephrectomy 1/19   2. Nephrolithiasis     Plan: Given AUA guidelines, she does not require imaging on an annual basis for her history of left renal cell carcinoma without evidence of recurrence > 5 yrs. CMP/CBC today. Return to office in 1 year.  Chief Complaint: Chief Complaint  Patient presents with   Renal cell carcinoma   HPI: Kristen Ortega is a 57 y.o. female who presents for continued evaluation of history of renal cell carcinoma of left kidney, nephrolithiasis, and history of gross hematuria.  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  Lasix renogram showed split function of 47% on left and 53% on right, obstruction on left.  She underwent cystoscopy, bilateral retrograde pyelograms, right ureteroscopy with holmium laser lithotripsy, insertion of bilateral ureteral stents on 11/19/17. The right renal calculi were fragmented. KUB from 11/27/17 showed excellent fragmentation of the right renal calculi with fragments along the stent in the distal right ureter.  Her right stent was removed on 12/02/18.  She is s/p left partial nephrectomy and open pyelolithotomy on 12/30/17. 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 She did well since the procedure. Her left ureteral stent was removed on 02/06/18.  Labs from 6/19: Cr 0.80 CT from 6/19 showed no recurrent or metastatic disease, bilateral nephrolithiasis. Labs from 12/14/2018 showed a normal CBC. CMP showed normal renal function and normal LFTs. Her potassium was abnormal at 2.8. She was treated with potassium supplementation. A repeat CMP from 12/22/2018 showed a normal  potassium of 4.2, normal renal function, and normal LFTs. CT from 1/20 showed no recurrence, no metastatic disease, and bilateral nephrolithiasis. CXR from 1/20 showed no acute changes. CMP 1/21: Cr 0.9, normal LFT's CBC 1/21: Normal CT from 1/21 showed changes consistent with a left partial nephrectomy with no suggestion of recurrence or metastatic disease in the abdomen or pelvis, multiple nonobstructive renal calculi bilaterally. Chest x-ray from 1/21 showed no acute changes. CMP from 2/22: Creatinine 0.82, normal LFTs CBC from 2/22: Normal  She underwent evaluation with a CT scan on 01/24/2021. The study showed no evidence of recurrent disease in the left kidney or evidence of metastatic disease. It did show a 8 mm right UPJ stone with some mild wall enhancement of the right proximal ureter, no evidence of obstruction, bilateral renal calculi. Urine culture from 01/24/2021: 50-100 K E. coli. She was started on Bactrim DS. Chest x-ray from 3/22 showed no acute abnormalities.  She underwent right ESL on 02/06/21. She passed stone fragments following the procedure. KUB from 02/14/21 showed a 3 x 6 mm calcification lateral to the right L4 transverse process. KUB from 02/28/2021 showed bilateral renal calcifications and calcifications in the area of the right distal ureter measuring 4 x 5 mm. KUB from 03/16/2021 showed further distal migration of the calcifications in the right distal ureter. KUB imaging from 04/04/2021 showed persistent calcifications in the area of the right distal ureter unchanged from prior studies. She underwent cystoscopy, bilateral retrograde pyelograms, and right ureteroscopy on 04/23/2021. No stones were seen in the right ureter.  At her visit in January 2023, she reported gross hematuria for the past 2-3 months.  She reported some intermittent  bilateral flank pain.  Her urine had been brown in color, typically in the morning.  No dysuria.  Urine culture from 11/21/2021 showed no  growth. CMP from 11/21/2021 showed normal renal function and normal LFTs. CBC showed a white blood cell count of 10.8. Urinalysis from 12/25/2021 showed >30 RBCs and 6-10 WBCs.  Urine culture showed <10K mixed flora. CT hematuria protocol from 01/17/2022 demonstrated multiple small bilateral renal calculi greater on the left, unchanged postoperative appearance of the left kidney status post partial nephrectomy, no evidence of recurrence or metastatic disease, no hydronephrosis. She continued to have episodes of gross hematuria. She was evaluated with cystoscopy on 02/01/2022.  Patient was noted to have squamous metaplasia but no other bladder or urethral abnormalities.  Urine cytology showed atypical urothelial cells.  CT abdomen and pelvis with and without contrast from 04/04/2023 showed bilateral nephrolithiasis, no evidence of obstruction, no evidence of recurrent or metastatic renal cell carcinoma. CMP and CBC were normal.  Chest x-ray from 4/24 showed no acute abnormalities.  She returns today for annual follow-up.  She is doing well.  No recent episodes of gross hematuria.  No UTI symptoms.  No kidney stone symptoms.  Her weight is stable.  She has a good appetite.  No lower urinary tract symptoms.   Portions of the above documentation were copied from a prior visit for review purposes only.  Allergies: No Known Allergies  PMH: Past Medical History:  Diagnosis Date   Cancer (HCC) 12/2017   kidney cancer   Hyperlipidemia    Hypertension    Vaginal Pap smear, abnormal     PSH: Past Surgical History:  Procedure Laterality Date   CESAREAN SECTION     x2   COLONOSCOPY  02/14/2020   COLPOSCOPY     KIDNEY SURGERY     kidney cancer removed 12-2017   WISDOM TOOTH EXTRACTION      SH: Social History   Tobacco Use   Smoking status: Former    Types: Cigarettes   Smokeless tobacco: Never  Substance Use Topics   Alcohol use: Yes    Comment: occasional   Drug use: No     ROS: Constitutional:  Negative for fever, chills, weight loss CV: Negative for chest pain, previous MI, hypertension Respiratory:  Negative for shortness of breath, wheezing, sleep apnea, frequent cough GI:  Negative for nausea, vomiting, bloody stool, GERD  PE: BP (!) 150/89   Pulse 60   Ht 5\' 4"  (1.626 m)   Wt 211 lb (95.7 kg)   BMI 36.22 kg/m  GENERAL APPEARANCE:  Well appearing, well developed, well nourished, NAD HEENT:  Atraumatic, normocephalic, oropharynx clear NECK:  Supple without lymphadenopathy or thyromegaly ABDOMEN:  Soft, non-tender, no masses EXTREMITIES:  Moves all extremities well, without clubbing, cyanosis, or edema NEUROLOGIC:  Alert and oriented x 3, normal gait, CN II-XII grossly intact MENTAL STATUS:  appropriate BACK:  Non-tender to palpation, No CVAT SKIN:  Warm, dry, and intact   Results: U/A: 11-30 WBCs, 3-10 RBCs, few bacteria, nitrite negative

## 2024-04-14 ENCOUNTER — Encounter: Payer: Self-pay | Admitting: Urology

## 2024-04-14 LAB — COMPREHENSIVE METABOLIC PANEL WITH GFR
ALT: 15 IU/L (ref 0–32)
AST: 22 IU/L (ref 0–40)
Albumin: 4.3 g/dL (ref 3.8–4.9)
Alkaline Phosphatase: 88 IU/L (ref 44–121)
BUN/Creatinine Ratio: 18 (ref 9–23)
BUN: 17 mg/dL (ref 6–24)
Bilirubin Total: 0.5 mg/dL (ref 0.0–1.2)
CO2: 24 mmol/L (ref 20–29)
Calcium: 9.1 mg/dL (ref 8.7–10.2)
Chloride: 104 mmol/L (ref 96–106)
Creatinine, Ser: 0.97 mg/dL (ref 0.57–1.00)
Globulin, Total: 2.8 g/dL (ref 1.5–4.5)
Glucose: 87 mg/dL (ref 70–99)
Potassium: 3.9 mmol/L (ref 3.5–5.2)
Sodium: 144 mmol/L (ref 134–144)
Total Protein: 7.1 g/dL (ref 6.0–8.5)
eGFR: 68 mL/min/{1.73_m2} (ref >59)

## 2024-04-14 LAB — CBC
Hematocrit: 45 % (ref 34.0–46.6)
Hemoglobin: 14.3 g/dL (ref 11.1–15.9)
MCH: 28 pg (ref 26.6–33.0)
MCHC: 31.8 g/dL (ref 31.5–35.7)
MCV: 88 fL (ref 79–97)
Platelets: 191 10*3/uL (ref 150–450)
RBC: 5.1 x10E6/uL (ref 3.77–5.28)
RDW: 12.5 % (ref 11.7–15.4)
WBC: 7.7 10*3/uL (ref 3.4–10.8)

## 2024-05-09 ENCOUNTER — Encounter: Payer: Self-pay | Admitting: Family Medicine

## 2024-05-09 DIAGNOSIS — F321 Major depressive disorder, single episode, moderate: Secondary | ICD-10-CM

## 2024-05-10 MED ORDER — ESCITALOPRAM OXALATE 20 MG PO TABS
20.0000 mg | ORAL_TABLET | Freq: Every day | ORAL | 0 refills | Status: DC
Start: 2024-05-10 — End: 2024-06-21

## 2024-06-04 ENCOUNTER — Other Ambulatory Visit: Payer: Self-pay | Admitting: Family Medicine

## 2024-06-04 DIAGNOSIS — F321 Major depressive disorder, single episode, moderate: Secondary | ICD-10-CM

## 2024-06-19 NOTE — Progress Notes (Unsigned)
 [facility] [address] [place], [place] [address] [phone]  Date:  06/21/2024   Name:  Demetric Parslow   DOB:  Jun 01, 1966   MRN:  [id number]  PCP:  Watt Harlene BROCKS, MD    Chief Complaint: No chief complaint on file.   History of Present Illness:  Maliaka Brasington is a 58 y.o. very pleasant female patient who presents with the following:  Patient seen today for physical exam.  Most recent visit with myself April 2024-  history of hypertension, left renal cell carcinoma status post partial nephrectomy, significant degenerative disc disease with resultant back pain, taking tramadol    She was seen by her urologist, Dr. Roseann April of this year Assessment: 1. Renal cell carcinoma of left kidney;  clear cell carcinoma, [id number]; s/p left partial nephrectomy 1/19   2. Nephrolithiasis    Plan: Given AUA guidelines, she does not require imaging on an annual basis for her history of left renal cell carcinoma without evidence of recurrence > 5 yrs. CMP/CBC today. Return to office in 1 year.  Lexapro  20 Crestor   Mammogram can be updated Tetanus is up-to-date Shingrix  complete Pap smear was completed last year and is negative-however as she had HPV in 2021 we need to do a Pap again today Patient Active Problem List   Diagnosis Date Noted   Gross hematuria 02/01/2022   Nephrolithiasis 12/25/2021   Chronic back pain 03/28/2020   Prediabetes 01/31/2020   Dyslipidemia 01/31/2020   Renal cell carcinoma (HCC) 01/29/2020   Benign essential HTN 09/17/2017   Scoliosis of thoracolumbar spine 09/17/2017    Past Medical History:  Diagnosis Date   Cancer (HCC) 12/2017   kidney cancer   Hyperlipidemia    Hypertension    Vaginal Pap smear, abnormal     Past Surgical History:  Procedure Laterality Date   CESAREAN SECTION     x2   COLONOSCOPY  02/14/2020   COLPOSCOPY     KIDNEY SURGERY     kidney cancer removed  12-2017   WISDOM TOOTH EXTRACTION      Social History   Tobacco Use   Smoking status: Former    Types: Cigarettes   Smokeless tobacco: Never  Substance Use Topics   Alcohol use: Yes    Comment: occasional   Drug use: No    Family History  Problem Relation Age of Onset   Stroke Paternal Grandmother    Hypertension Father    Cancer Mother        lymphoma   Diabetes Neg Hx    Colon cancer Neg Hx    Colon polyps Neg Hx    Esophageal cancer Neg Hx    Rectal cancer Neg Hx    Stomach cancer Neg Hx     No Known Allergies  Medication list has been reviewed and updated.  Current Outpatient Medications on File Prior to Visit  Medication Sig Dispense Refill   albuterol  (VENTOLIN  HFA) 108 (90 Base) MCG/ACT inhaler Inhale 2 puffs into the lungs every 6 (six) hours as needed for wheezing or shortness of breath. 1 each 6   calcium  carbonate (TUMS - DOSED IN MG ELEMENTAL CALCIUM ) 500 MG chewable tablet Chew 1 tablet by mouth daily.     diphenhydrAMINE (BENADRYL) 50 MG tablet Take 50 mg by mouth at bedtime as needed for itching.     escitalopram  (LEXAPRO ) 20 MG tablet Take 1 tablet (20 mg total) by mouth daily.  30 tablet 0   famotidine  (PEPCID ) 20 MG tablet Take by mouth.     nystatin -triamcinolone  (MYCOLOG II) cream Apply 1 Application topically 2 (two) times daily. Use as needed 90 g 2   Omega-3 Fatty Acids (FISH OIL) 500 MG CAPS Take by mouth.     rosuvastatin  (CRESTOR ) 20 MG tablet Take 0.5-1 tablets (10-20 mg total) by mouth daily. 30 tablet 0   No current facility-administered medications on file prior to visit.    Review of Systems:  As per HPI- otherwise negative.   Physical Examination: There were no vitals filed for this visit. There were no vitals filed for this visit. There is no height or weight on file to calculate BMI. Ideal Body Weight:    GEN: no acute distress. HEENT: Atraumatic, Normocephalic.  Ears and Nose: No external deformity. CV: RRR, No M/G/R. No  JVD. No thrill. No extra heart sounds. PULM: CTA B, no wheezes, crackles, rhonchi. No retractions. No resp. distress. No accessory muscle use. ABD: S, NT, ND, +BS. No rebound. No HSM. EXTR: No c/c/e PSYCH: Normally interactive. Conversant.    Assessment and Plan: *** Patient seen today for physical exam Encouraged healthy diet and exercise routine Will plan further follow- up pending labs.  Signed Harlene Schroeder, MD

## 2024-06-21 ENCOUNTER — Ambulatory Visit (INDEPENDENT_AMBULATORY_CARE_PROVIDER_SITE_OTHER): Admitting: Family Medicine

## 2024-06-21 ENCOUNTER — Other Ambulatory Visit (HOSPITAL_COMMUNITY)
Admission: RE | Admit: 2024-06-21 | Discharge: 2024-06-21 | Disposition: A | Discharge status: Home / Self Care | Source: Ambulatory Visit | Attending: Family Medicine | Admitting: Family Medicine

## 2024-06-21 ENCOUNTER — Encounter: Payer: Self-pay | Admitting: Family Medicine

## 2024-06-21 VITALS — BP 142/80 | HR 61 | Temp 98.1°F | Ht 64.0 in | Wt 213.4 lb

## 2024-06-21 DIAGNOSIS — Z124 Encounter for screening for malignant neoplasm of cervix: Secondary | ICD-10-CM | POA: Diagnosis present

## 2024-06-21 DIAGNOSIS — R053 Chronic cough: Secondary | ICD-10-CM

## 2024-06-21 DIAGNOSIS — E785 Hyperlipidemia, unspecified: Secondary | ICD-10-CM

## 2024-06-21 DIAGNOSIS — Z0000 Encounter for general adult medical examination without abnormal findings: Secondary | ICD-10-CM | POA: Diagnosis not present

## 2024-06-21 DIAGNOSIS — Z1329 Encounter for screening for other suspected endocrine disorder: Secondary | ICD-10-CM | POA: Diagnosis not present

## 2024-06-21 DIAGNOSIS — F321 Major depressive disorder, single episode, moderate: Secondary | ICD-10-CM

## 2024-06-21 DIAGNOSIS — R7303 Prediabetes: Secondary | ICD-10-CM

## 2024-06-21 DIAGNOSIS — C642 Malignant neoplasm of left kidney, except renal pelvis: Secondary | ICD-10-CM

## 2024-06-21 DIAGNOSIS — Z13220 Encounter for screening for lipoid disorders: Secondary | ICD-10-CM | POA: Diagnosis not present

## 2024-06-21 DIAGNOSIS — M419 Scoliosis, unspecified: Secondary | ICD-10-CM

## 2024-06-21 DIAGNOSIS — I10 Essential (primary) hypertension: Secondary | ICD-10-CM

## 2024-06-21 DIAGNOSIS — Z1231 Encounter for screening mammogram for malignant neoplasm of breast: Secondary | ICD-10-CM

## 2024-06-21 MED ORDER — ALBUTEROL SULFATE HFA 108 (90 BASE) MCG/ACT IN AERS: 2.0000 | INHALATION_SPRAY | Freq: Four times a day (QID) | RESPIRATORY_TRACT | 6 refills | Status: AC | PRN

## 2024-06-21 MED ORDER — ESCITALOPRAM OXALATE 20 MG PO TABS
20.0000 mg | ORAL_TABLET | Freq: Every day | ORAL | 3 refills | Status: AC
Start: 2024-06-21 — End: ?

## 2024-06-21 MED ORDER — ROSUVASTATIN CALCIUM 20 MG PO TABS: 10.0000 mg | ORAL_TABLET | Freq: Every day | ORAL | 3 refills | Status: AC

## 2024-06-21 NOTE — Patient Instructions (Signed)
 Good to see you today!  I will be in touch with your labs and we will work on getting you seen by neurosurgery about your back.  I am requesting a copy of your MRI

## 2024-06-22 ENCOUNTER — Encounter: Payer: Self-pay | Admitting: Family Medicine

## 2024-06-22 LAB — HEMOGLOBIN A1C: Hgb A1c MFr Bld: 5.8 % (ref 4.6–6.5)

## 2024-06-22 LAB — LIPID PANEL
Cholesterol: 155 mg/dL (ref 0–200)
HDL: 39.9 mg/dL (ref >39.00)
LDL Cholesterol: 77 mg/dL (ref 0–99)
NonHDL: 115.15
Total CHOL/HDL Ratio: 4
Triglycerides: 193 mg/dL — ABNORMAL HIGH (ref 0.0–149.0)
VLDL: 38.6 mg/dL (ref 0.0–40.0)

## 2024-06-22 LAB — TSH: TSH: 1.31 u[IU]/mL (ref 0.35–5.50)

## 2024-06-28 ENCOUNTER — Ambulatory Visit (HOSPITAL_BASED_OUTPATIENT_CLINIC_OR_DEPARTMENT_OTHER)
Admission: RE | Admit: 2024-06-28 | Discharge: 2024-06-28 | Disposition: A | Discharge status: Home / Self Care | Source: Ambulatory Visit | Attending: Family Medicine | Admitting: Family Medicine

## 2024-06-28 ENCOUNTER — Encounter (HOSPITAL_BASED_OUTPATIENT_CLINIC_OR_DEPARTMENT_OTHER): Payer: Self-pay

## 2024-06-28 ENCOUNTER — Encounter: Payer: Self-pay | Admitting: Family Medicine

## 2024-06-28 DIAGNOSIS — Z1231 Encounter for screening mammogram for malignant neoplasm of breast: Secondary | ICD-10-CM | POA: Diagnosis present

## 2024-06-28 LAB — CYTOLOGY - PAP
Adequacy: ABSENT
Comment: NEGATIVE
Diagnosis: NEGATIVE
Diagnosis: REACTIVE
High risk HPV: NEGATIVE

## 2024-06-29 NOTE — Progress Notes (Signed)
 Referring Physician:  Watt Harlene BROCKS, MD [address] [place],  [place] [address]  Primary Physician:  Watt Harlene BROCKS, MD  History of Present Illness: 07/05/2024 [name] has a history of HTN, scoliosis, renal cell carcinoma s/p partial nephrectomy, prediabetes, dyslipidemia.   She has seen Dr. Bonner in the past and had lumbar ESI. No previous improvement with PT.   She has 6-12 month history of intermittent stiffness in her neck with limited ROM. She has intermittent spasms with intermittent right shoulder pain into her arm with numbness. No weakness. No pain in left arm. No known aggravating factors. Heat helps.   Also with chronic intermittent chronic LBP that is worse with walking x years. She has intermittent lateral leg pain to her knee, left > right when pain is worse. Some improvement when she stops walking to sit. She has intermittent numbness in left leg when laying on left side. Some relief with heat. No weakness in her legs.   No relief with previous lumbar ESI in 2019.   Tobacco use: Does not smoke.   Bowel/Bladder Dysfunction: none  Conservative measures:  Physical therapy:  has not participated in recently Multimodal medical therapy including regular antiinflammatories: Tramadol , Flexeril  Injections:  02/17/2018 ESI L5-S1 by Dr. Bonner  Past Surgery: no spine surgery  Kristen Ortega has no symptoms of cervical myelopathy.  The symptoms are causing a significant impact on the patient's life.   Review of Systems:  A 10 point review of systems is negative, except for the pertinent positives and negatives detailed in the HPI.  Past Medical History: Past Medical History:  Diagnosis Date   Anxiety    Arthritis 09/17/2017   Cancer (HCC) 12/2017   kidney cancer   Depression    Hyperlipidemia    Hypertension    Vaginal Pap smear, abnormal     Past Surgical History: Past Surgical History:  Procedure Laterality Date    CESAREAN SECTION     x2   COLONOSCOPY  02/14/2020   COLPOSCOPY     KIDNEY SURGERY     kidney cancer removed 12-2017   WISDOM TOOTH EXTRACTION      Allergies: Allergies as of 07/05/2024   (No Known Allergies)    Medications: Outpatient Encounter Medications as of 07/05/2024  Medication Sig   albuterol  (VENTOLIN  HFA) 108 (90 Base) MCG/ACT inhaler Inhale 2 puffs into the lungs every 6 (six) hours as needed for wheezing or shortness of breath.   calcium  carbonate (TUMS - DOSED IN MG ELEMENTAL CALCIUM ) 500 MG chewable tablet Chew 1 tablet by mouth daily.   diphenhydrAMINE (BENADRYL) 50 MG tablet Take 50 mg by mouth at bedtime as needed for itching.   escitalopram  (LEXAPRO ) 20 MG tablet Take 1 tablet (20 mg total) by mouth daily.   famotidine  (PEPCID ) 20 MG tablet Take by mouth.   nystatin -triamcinolone  (MYCOLOG II) cream Apply 1 Application topically 2 (two) times daily. Use as needed   Omega-3 Fatty Acids (FISH OIL) 500 MG CAPS Take by mouth.   rosuvastatin  (CRESTOR ) 20 MG tablet Take 0.5-1 tablets (10-20 mg total) by mouth daily.   No facility-administered encounter medications on file as of 07/05/2024.    Social History: Social History   Tobacco Use   Smoking status: Former    Types: Cigarettes   Smokeless tobacco: Never  Vaping Use   Vaping status: Never Used  Substance Use Topics   Alcohol use: Yes    Comment: occasional   Drug  use: No    Family Medical History: Family History  Problem Relation Age of Onset   Stroke Paternal Grandmother    Hypertension Father    Heart disease Father    Cancer Mother        lymphoma   COPD Mother    Diabetes Neg Hx    Colon cancer Neg Hx    Colon polyps Neg Hx    Esophageal cancer Neg Hx    Rectal cancer Neg Hx    Stomach cancer Neg Hx     Physical Examination: Vitals:   07/05/24 1011  BP: 114/76    General: Patient is well developed, well nourished, calm, collected, and in no apparent distress. Attention to examination  is appropriate.  Respiratory: Patient is breathing without any difficulty.   NEUROLOGICAL:     Awake, alert, oriented to person, place, and time.  Speech is clear and fluent. Fund of knowledge is appropriate.   Cranial Nerves: Pupils equal round and reactive to light.  Facial tone is symmetric.    No posterior lumbar tenderness.   No abnormal lesions on exposed skin.   Strength: Side Biceps Triceps Deltoid Interossei Grip Wrist Ext. Wrist Flex.  R 5 5 5 5 5 5 5   L 5 5 5 5 5 5 5    Side Iliopsoas Quads Hamstring PF DF EHL  R 5 5 5 5 5 5   L 5 5 5 5 5 5    Reflexes are 2+ and symmetric at the biceps, brachioradialis, patella and achilles.   Hoffman's is absent.  Clonus is not present.   Bilateral upper and lower extremity sensation is intact to light touch.     Good ROM of both shoulders with no pain.   No pain with IR/ER of both hips.   She has abnormal gait- she limps.   Medical Decision Making  Imaging: Lumbar xrays dated 04/27/24:  FINDINGS: Moderate levoscoliosis, apex at L3.   No fracture. No bone lesion. Mild, grade 1, retrolisthesis of L1 and L2 and anterolisthesis of L4 on L5.   Moderate loss of disc height from T12-L1 through L3-L4. Moderate to marked loss of disc height at L4-L5. Mild loss of disc height at L5-S1. Small anterior endplate osteophytes noted throughout the visualized spine. Bilateral facet degenerative changes noted throughout most of the lumbar spine   Few scattered aortic atherosclerotic vascular calcifications. Left intrarenal stones.   IMPRESSION: 1. No fracture or acute finding. 2. Levoscoliosis and degenerative changes as detailed, without significant change from the prior CT scan.     Electronically Signed   By: Alm Parkins M.D.   On: 05/02/2023 12:22    I have personally reviewed the images and agree with the above interpretation.  Assessment and Plan: [name]  has a 6-12 month history of intermittent stiffness in her  neck with limited ROM. She has intermittent spasms with intermittent right shoulder pain into her arm with numbness. No weakness. No pain in left arm.   No cervical imaging is available.   She also has chronic intermittent LBP that is worse with walking x years. She has intermittent lateral leg pain to her knee, left > right when pain is worse. Some improvement when she stops walking to sit. No weakness in her legs.   She has known moderate lumbar spondylosis and DDD with retrolisthesis L1-L2 and slight slip L4-L5.   Treatment options discussed with patient and following plan made:   - MRI of lumbar spine to evaluate chronic  LBP and bilateral leg pain.  - When she gets MRI, she will get lumbar/cervical xrays with flex/ext.  - History of renal cell carcinoma s/p partial nephrectomy- would avoid NSAIDs.  - Will schedule follow up visit to review MRI/xray results once I get them back.   I spent a total of 40 minutes in face-to-face and non-face-to-face activities related to this patient's care today including review of outside records, review of imaging, review of symptoms, physical exam, discussion of differential diagnosis, discussion of treatment options, and documentation.   Thank you for involving me in the care of this patient.   Glade Boys PA-C Dept. of Neurosurgery

## 2024-07-02 ENCOUNTER — Other Ambulatory Visit: Payer: Self-pay | Admitting: Family Medicine

## 2024-07-02 DIAGNOSIS — R928 Other abnormal and inconclusive findings on diagnostic imaging of breast: Secondary | ICD-10-CM

## 2024-07-05 ENCOUNTER — Ambulatory Visit (INDEPENDENT_AMBULATORY_CARE_PROVIDER_SITE_OTHER): Admitting: Orthopedic Surgery

## 2024-07-05 ENCOUNTER — Encounter: Payer: Self-pay | Admitting: Orthopedic Surgery

## 2024-07-05 VITALS — BP 114/76 | Ht 64.0 in | Wt 209.0 lb

## 2024-07-05 DIAGNOSIS — M542 Cervicalgia: Secondary | ICD-10-CM | POA: Diagnosis not present

## 2024-07-05 DIAGNOSIS — M47816 Spondylosis without myelopathy or radiculopathy, lumbar region: Secondary | ICD-10-CM

## 2024-07-05 DIAGNOSIS — M5416 Radiculopathy, lumbar region: Secondary | ICD-10-CM

## 2024-07-05 DIAGNOSIS — M51362 Other intervertebral disc degeneration, lumbar region with discogenic back pain and lower extremity pain: Secondary | ICD-10-CM

## 2024-07-05 DIAGNOSIS — M419 Scoliosis, unspecified: Secondary | ICD-10-CM

## 2024-07-05 DIAGNOSIS — M4316 Spondylolisthesis, lumbar region: Secondary | ICD-10-CM

## 2024-07-05 NOTE — Patient Instructions (Signed)
 It was so nice to see you today. Thank you so much for coming in.    You have a mild curve/scoliosis in your lower back with wear and tear (arthritis).   I want to get an MRI of your lower back to look into things further. We will get this approved through your insurance and [facility] will call you to schedule the appointment. Ask about your patient responsibility. You do not need to pay this prior to getting MRI, they can bill you.   When you get your lower back MRI, I want you to get xrays of your neck and lower back.   After you have the MRI and xrays, it can take 14-28 days for me to get the results back. If I don't have them in 2 weeks, we will call to try to get the results.   Once I have the results, we will call you to schedule a follow up visit with me to review them.   Please do not hesitate to call if you have any questions or concerns. You can also message me in MyChart.   Glade Boys PA-C [phone]     The physicians and staff at [facility] at [facility] are committed to providing excellent care. You may receive a survey asking for feedback about your experience at our office. We value you your feedback and appreciate you taking the time to to fill it out. The [facility] leadership team is also available to discuss your experience in person, feel free to contact us  [phone].

## 2024-07-12 ENCOUNTER — Ambulatory Visit
Admission: RE | Admit: 2024-07-12 | Discharge: 2024-07-12 | Disposition: A | Discharge status: Home / Self Care | Source: Ambulatory Visit | Attending: Family Medicine | Admitting: Family Medicine

## 2024-07-12 ENCOUNTER — Ambulatory Visit

## 2024-07-12 DIAGNOSIS — R928 Other abnormal and inconclusive findings on diagnostic imaging of breast: Secondary | ICD-10-CM

## 2024-07-19 ENCOUNTER — Ambulatory Visit
Admission: RE | Admit: 2024-07-19 | Discharge: 2024-07-19 | Disposition: A | Discharge status: Home / Self Care | Source: Ambulatory Visit | Attending: Orthopedic Surgery | Admitting: Orthopedic Surgery

## 2024-07-19 DIAGNOSIS — M47816 Spondylosis without myelopathy or radiculopathy, lumbar region: Secondary | ICD-10-CM

## 2024-07-19 DIAGNOSIS — M419 Scoliosis, unspecified: Secondary | ICD-10-CM

## 2024-07-19 DIAGNOSIS — M5416 Radiculopathy, lumbar region: Secondary | ICD-10-CM

## 2024-07-19 DIAGNOSIS — M542 Cervicalgia: Secondary | ICD-10-CM

## 2024-08-08 NOTE — Progress Notes (Unsigned)
 Referring Physician:  Watt Kristen BROCKS, MD [address] [place],  [place] [address]  Primary Physician:  Watt Kristen BROCKS, MD  History of Present Illness: Kristen Ortega has a history of HTN, scoliosis, renal cell carcinoma s/p partial nephrectomy, prediabetes, dyslipidemia.   She has seen Dr. Bonner in the past and had lumbar ESI. No previous improvement with PT.   Last seen by me on 07/05/24 for intermittent stiffness in her neck along with chronic intermittent LBP with intermittent leg pain.   She has known moderate lumbar spondylosis and DDD with retrolisthesis L1-L2 and slight slip L4-L5.   She is here to review her cervical/lumbar xrays along with lumbar MRI.   She continues with intermittent stiffness in her neck with limited ROM. She has intermittent right shoulder pain into her arm  to her elbow with numbness. No weakness. No pain in left arm.   Also with chronic intermittent chronic LBP that is worse with walking x years. She has intermittent left lateral leg pain to her knee when pain is worse. Some improvement when she stops walking to sit. Some relief with heat. She feels weakness in left leg especially when doing stairs.   Tobacco use: Does not smoke.   Bowel/Bladder Dysfunction: none  Conservative measures:  Physical therapy:  has not participated in recently Multimodal medical therapy including regular antiinflammatories: Tramadol , Flexeril  Injections:  02/17/2018 ESI L5-S1 by Dr. Bonner  Past Surgery: no spine surgery  Kristen Ortega has no symptoms of cervical myelopathy.  The symptoms are causing a significant impact on the patient's life.   Review of Systems:  A 10 point review of systems is negative, except for the pertinent positives and negatives detailed in the HPI.  Past Medical History: Past Medical History:  Diagnosis Date   Anxiety    Arthritis 09/17/2017   Cancer (HCC) 12/2017   kidney cancer   Depression     Hyperlipidemia    Hypertension    Vaginal Pap smear, abnormal     Past Surgical History: Past Surgical History:  Procedure Laterality Date   CESAREAN SECTION     x2   COLONOSCOPY  02/14/2020   COLPOSCOPY     KIDNEY SURGERY     kidney cancer removed 12-2017   WISDOM TOOTH EXTRACTION      Allergies: Allergies as of 08/11/2024   (No Known Allergies)    Medications: Outpatient Encounter Medications as of 08/11/2024  Medication Sig   albuterol  (VENTOLIN  HFA) 108 (90 Base) MCG/ACT inhaler Inhale 2 puffs into the lungs every 6 (six) hours as needed for wheezing or shortness of breath.   calcium  carbonate (TUMS - DOSED IN MG ELEMENTAL CALCIUM ) 500 MG chewable tablet Chew 1 tablet by mouth daily.   diphenhydrAMINE (BENADRYL) 50 MG tablet Take 50 mg by mouth at bedtime as needed for itching.   escitalopram  (LEXAPRO ) 20 MG tablet Take 1 tablet (20 mg total) by mouth daily.   famotidine  (PEPCID ) 20 MG tablet Take by mouth.   nystatin -triamcinolone  (MYCOLOG II) cream Apply 1 Application topically 2 (two) times daily. Use as needed   Omega-3 Fatty Acids (FISH OIL) 500 MG CAPS Take by mouth.   rosuvastatin  (CRESTOR ) 20 MG tablet Take 0.5-1 tablets (10-20 mg total) by mouth daily.   No facility-administered encounter medications on file as of 08/11/2024.    Social History: Social History   Tobacco Use   Smoking status: Former    Types: Cigarettes   Smokeless tobacco: Never  Vaping Use   Vaping status: Never Used  Substance Use Topics   Alcohol use: Yes    Comment: occasional   Drug use: No    Family Medical History: Family History  Problem Relation Age of Onset   Stroke Paternal Grandmother    Hypertension Father    Heart disease Father    Cancer Mother        lymphoma   COPD Mother    Diabetes Neg Hx    Colon cancer Neg Hx    Colon polyps Neg Hx    Esophageal cancer Neg Hx    Rectal cancer Neg Hx    Stomach cancer Neg Hx     Physical Examination: Vitals:    08/11/24 1440 08/11/24 1501  BP: (!) 152/88 126/80      Awake, alert, oriented to person, place, and time.  Speech is clear and fluent. Fund of knowledge is appropriate.   Cranial Nerves: Pupils equal round and reactive to light.  Facial tone is symmetric.    No abnormal lesions on exposed skin.   Strength: Side Biceps Triceps Deltoid Interossei Grip Wrist Ext. Wrist Flex.  R 5 5 5 5 5 5 5   L 5 5 5 5 5 5 5    Side Iliopsoas Quads Hamstring PF DF EHL  R 5 5 5 5 5 5   L 5 5 5 5 5 5    Reflexes are 2+ and symmetric at the biceps, brachioradialis, patella and achilles.   Hoffman's is absent.  Clonus is not present.   Bilateral upper and lower extremity sensation is intact to light touch.     Good ROM of both shoulders with no pain.   No pain with IR/ER of both hips.   She has abnormal gait- she limps.   Medical Decision Making  Imaging: Cervical xrays dated 07/19/24:  FINDINGS: Normal alignment and prevertebral soft tissues. Mild degenerative disc disease changes at C3 through C7. These mid and lower cervical levels all demonstrate some degree of disc space narrowing, sclerosis and bony spurring. Facets are aligned. No instability with flexion and extension. Intact odontoid. Trachea midline. Lung apices are clear.   IMPRESSION: Cervical degenerative changes as above. No acute finding by plain radiography.     Electronically Signed   By: CHRISTELLA.  Shick M.D.   On: 07/22/2024 11:46   Lumbar MRI dated 07/19/24:  FINDINGS:   BONES AND ALIGNMENT: Levoconvex curvature of the lumbar spine centered at L5. Type 2 and type 3 Modic changes are present on the right at L2-3 and L3-4. Type 2 Modic changes are present on the right at L1-2.   SPINAL CORD: Conus medullaris terminates at L1-2.   SOFT TISSUES: Scarring and parenchymal thinning is present at the lower pole of the left kidney.   L1-L2: Slight retrolisthesis present at L1-2. Uncovertebral broad-based disc protrusion is  present. Asymmetric right-sided facet hypertrophy is present. Moderate right and mild left foraminal narrowing is present.   L2-L3: A broad-based disc protrusion is present. Asymmetric right-sided facet hypertrophy is present. Moderate right foraminal narrowing is present.   L3-L4: A broad-based disc protrusion is present. Advanced facet hypertrophy is present on the right. Mild foraminal narrowing is worse on the right.   L4-L5: A broad-based disc protrusion is present. Asymmetric left-sided facet hypertrophy is present. Mild left subarticular and moderate left foraminal narrowing is present.   L5-S1: A broad-based disc protrusion is present. Moderate facet hypertrophy is worse on the left. Moderate left and mild right foraminal  stenosis is present.   IMPRESSION: 1. Multilevel degenerative changes with broad-based disc protrusions and facet hypertrophy resulting in foraminal narrowing as described above.   Electronically signed by: Lonni Necessary MD 07/25/2024 12:50 PM EDT RP Workstation: [id number]    Lumbar xrays dated 07/19/24:  FINDINGS: Interval worsening moderate to severe multilevel lower thoracic and lumbar degenerative disc disease with associated levoscoliosis measuring 28 degrees (Cobb angle) from T11-L4. Preserved vertebral body heights. No severe compression fracture, wedge-shaped deformity or focal kyphosis. No instability with limited flexion and extension. Aorta atherosclerotic. SI joints are maintained. Similar pattern of scattered subcentimeter left renal calculi.   IMPRESSION: 1. Interval worsening moderate to severe multilevel lower thoracic and lumbar degenerative disc disease with associated levoscoliosis. 2. No acute finding by plain radiography.     Electronically Signed   By: CHRISTELLA.  Shick M.D.   On: 07/22/2024 11:41  I have personally reviewed the images and agree with the above interpretation.  Assessment and Plan: Ms. Jonsson has  intermittent stiffness in her neck with limited ROM. She has intermittent right shoulder pain into her arm  to her elbow with numbness. No weakness. No pain in left arm.   She has known cervical spondylosis with mild DDD.   Also with chronic intermittent chronic LBP that is worse with walking. She has intermittent left lateral leg pain to her knee when pain is worse. Some improvement when she stops walking to sit.   She has known moderate lumbar spondylosis and DDD with significant TL scoliosis. She has multilevel bilateral foraminal stenosis.   LBP likely from underlying spondylosis. Leg pain may be from foraminal stenosis L5-S1.   Treatment options discussed with patient and following plan made:   - Recommend PT for cervical and lumbar spine. She declines.  - She wants to hold on further treatment for neck right now. Consider cervical MRI if pain gets worse.  - History of renal cell carcinoma s/p partial nephrectomy- would avoid NSAIDs.  - Referral to pain management (Late[facility]) to consider lumbar injections.  - Follow up with me in 8 weeks and prn.   I spent a total of 20 minutes in face-to-face and non-face-to-face activities related to this patient's care today including review of outside records, review of imaging, review of symptoms, physical exam, discussion of differential diagnosis, discussion of treatment options, and documentation.   Glade Boys PA-C Dept. of Neurosurgery

## 2024-08-11 ENCOUNTER — Encounter: Payer: Self-pay | Admitting: Orthopedic Surgery

## 2024-08-11 ENCOUNTER — Ambulatory Visit (INDEPENDENT_AMBULATORY_CARE_PROVIDER_SITE_OTHER): Admitting: Orthopedic Surgery

## 2024-08-11 VITALS — BP 126/80 | Ht 64.0 in | Wt 209.0 lb

## 2024-08-11 DIAGNOSIS — M4185 Other forms of scoliosis, thoracolumbar region: Secondary | ICD-10-CM

## 2024-08-11 DIAGNOSIS — M4802 Spinal stenosis, cervical region: Secondary | ICD-10-CM | POA: Diagnosis not present

## 2024-08-11 DIAGNOSIS — M5030 Other cervical disc degeneration, unspecified cervical region: Secondary | ICD-10-CM | POA: Diagnosis not present

## 2024-08-11 DIAGNOSIS — M47812 Spondylosis without myelopathy or radiculopathy, cervical region: Secondary | ICD-10-CM

## 2024-08-11 DIAGNOSIS — M47816 Spondylosis without myelopathy or radiculopathy, lumbar region: Secondary | ICD-10-CM | POA: Diagnosis not present

## 2024-08-11 DIAGNOSIS — M48061 Spinal stenosis, lumbar region without neurogenic claudication: Secondary | ICD-10-CM

## 2024-08-11 DIAGNOSIS — M419 Scoliosis, unspecified: Secondary | ICD-10-CM

## 2024-08-11 DIAGNOSIS — M5416 Radiculopathy, lumbar region: Secondary | ICD-10-CM

## 2024-08-11 NOTE — Patient Instructions (Signed)
 It was so nice to see you today. Thank you so much for coming in.    You have scoliosis in your mid-lower back along with wear and tear (arthritis). This is likely causing your back and leg pain.   You have some wear and tear in your neck as well.   I want you to see pain management here in [place] (Dr. Marcelino) to discuss possible lumbar injections. They should call you to schedule an appointment or you can call them at [phone].   Let me know if you want to do PT for your neck or back. If your neck pain gets worse, we can consider an MRI of your neck.   I will see you back in 8 weeks. Please do not hesitate to call if you have any questions or concerns. You can also message me in MyChart.   Glade Boys PA-C [phone]     The physicians and staff at [facility] at [facility] are committed to providing excellent care. You may receive a survey asking for feedback about your experience at our office. We value you your feedback and appreciate you taking the time to to fill it out. The [facility] leadership team is also available to discuss your experience in person, feel free to contact us  [phone].

## 2024-09-21 ENCOUNTER — Ambulatory Visit
Attending: Student in an Organized Health Care Education/Training Program | Admitting: Student in an Organized Health Care Education/Training Program

## 2024-09-21 ENCOUNTER — Encounter: Payer: Self-pay | Admitting: Student in an Organized Health Care Education/Training Program

## 2024-09-21 VITALS — BP 169/88 | HR 60 | Temp 97.2°F | Resp 16 | Ht 64.0 in | Wt 215.0 lb

## 2024-09-21 DIAGNOSIS — G8929 Other chronic pain: Secondary | ICD-10-CM | POA: Diagnosis present

## 2024-09-21 DIAGNOSIS — M5416 Radiculopathy, lumbar region: Secondary | ICD-10-CM | POA: Diagnosis present

## 2024-09-21 DIAGNOSIS — G894 Chronic pain syndrome: Secondary | ICD-10-CM | POA: Insufficient documentation

## 2024-09-21 DIAGNOSIS — M4726 Other spondylosis with radiculopathy, lumbar region: Secondary | ICD-10-CM

## 2024-09-21 DIAGNOSIS — M47816 Spondylosis without myelopathy or radiculopathy, lumbar region: Secondary | ICD-10-CM | POA: Diagnosis present

## 2024-09-21 NOTE — Progress Notes (Signed)
 Safety precautions to be maintained throughout the outpatient stay will include: orient to surroundings, keep bed in low position, maintain call bell within reach at all times, provide assistance with transfer out of bed and ambulation.

## 2024-09-21 NOTE — Progress Notes (Signed)
 PROVIDER NOTE: Interpretation of information contained herein should be left to medically-trained personnel. Specific patient instructions are provided elsewhere under Patient Instructions section of medical record. This document was created in part using AI and STT-dictation technology, any transcriptional errors that may result from this process are unintentional.  Patient: Kristen Ortega  Service: E/M Encounter  Provider: Wallie Sherry, MD  DOB: [birth year]  Delivery: Face-to-face  Specialty: Interventional Pain Management  MRN: [id number]  Setting: Ambulatory outpatient facility  Specialty designation: 09  Type: New Patient  Location: Outpatient office facility  PCP: Watt Harlene BROCKS, MD  DOS: 09/21/2024    Referring Prov.: Hilma Hastings, PA-C   Primary Reason(s) for Visit: Encounter for initial evaluation of one or more chronic problems (new to examiner) potentially causing chronic pain, and posing a threat to normal musculoskeletal function. (Level of risk: High) CC: Back Pain (Mid to lower left is worse ), Hip Pain (Left ), and Knee Pain (Left )  HPI  Kristen Ortega is a 58 y.o. year old, female patient, who comes for the first time to our practice referred by Hilma Hastings, PA-C for our initial evaluation of her chronic pain. She has Benign essential HTN; Scoliosis of thoracolumbar spine; Renal cell carcinoma (HCC); Prediabetes; Dyslipidemia; Chronic back pain; Nephrolithiasis; Gross hematuria; Lumbar spondylosis; Chronic radicular lumbar pain; and Chronic pain syndrome on their problem list. Today she comes in for evaluation of her Back Pain (Mid to lower left is worse ), Hip Pain (Left ), and Knee Pain (Left )  Pain Assessment: Location: Left, Mid, Lower Back Radiating: ? into left hip and left knee Onset: More than a month ago Duration: Chronic pain Quality: Discomfort, Tightness, Spasm, Cramping, Burning Severity: 5 /10 (subjective, self-reported pain score)  Effect on ADL: as long as she is  sitting down she is okay.  the more activity the worse the pain.  the left knee gives out on her at times Timing: Constant Modifying factors: rest, OTC analgesics BP: (!) 169/88  HR: 60  Onset and Duration: Date of onset: 2018 and Present longer than 3 months Cause of pain: leaning over changing table  Severity: Getting worse, NAS-11 at its worse: 10/10, NAS-11 at its best: 4/10, NAS-11 now: 4/10, and NAS-11 on the average: 38/10 Timing: During activity or exercise Aggravating Factors: Bending, Kneeling, Lifiting, Prolonged standing, Twisting, Walking, Walking uphill, and Walking downhill Alleviating Factors: Resting and Sitting Associated Problems: Depression, Spasms, Pain that wakes patient up, and Pain that does not allow patient to sleep Quality of Pain: Aching, Annoying, Burning, and Uncomfortable Previous Examinations or Tests: MRI scan, Nerve block, and Neurosurgical evaluation Previous Treatments: Epidural steroid injections, Narcotic medications, Physical Therapy, and Stretching exercises  Kristen Ortega is being evaluated for possible interventional pain management therapies for the treatment of her chronic pain.   Discussed the use of AI scribe software for clinical note transcription with the patient, who gave verbal consent to proceed.  History of Present Illness   Kristen Ortega is a 58 year old female with chronic low back pain who presents for evaluation and management of her pain. She was referred by Mariellen Hastings Hilma for evaluation of her chronic back pain.  She has experienced back pain for several years, which worsens with movement and walking. The pain is described as tightening and is more pronounced with rapid movements or twisting, sometimes affecting her hip.  She also has difficulty using her left knee, particularly when climbing stairs, as she cannot use her left leg to  step up and relies on her right leg instead.  In 2018, she received an epidural steroid  injection, which did not provide relief. She also underwent physical therapy at that time without significant improvement. An MRI has shown arthritis in the spine at L3, L4, and L5.  No use of blood thinners and no diabetes.   Of note she has a history that is positive for scoliosis, renal cell carcinoma status post partial nephrectomy.  She also struggles with cervical spine pain related to cervical spondylosis and cervical degenerative disc disease.  She states that her low back pain is much more painful than her cervical spine pain and would like to address that first.      Meds   Current Outpatient Medications:    albuterol  (VENTOLIN  HFA) 108 (90 Base) MCG/ACT inhaler, Inhale 2 puffs into the lungs every 6 (six) hours as needed for wheezing or shortness of breath., Disp: 1 each, Rfl: 6   calcium  carbonate (TUMS - DOSED IN MG ELEMENTAL CALCIUM ) 500 MG chewable tablet, Chew 1 tablet by mouth daily., Disp: , Rfl:    diphenhydrAMINE (BENADRYL) 50 MG tablet, Take 50 mg by mouth at bedtime as needed for itching., Disp: , Rfl:    escitalopram  (LEXAPRO ) 20 MG tablet, Take 1 tablet (20 mg total) by mouth daily., Disp: 90 tablet, Rfl: 3   famotidine  (PEPCID ) 20 MG tablet, Take by mouth., Disp: , Rfl:    nystatin -triamcinolone  (MYCOLOG II) cream, Apply 1 Application topically 2 (two) times daily. Use as needed, Disp: 90 g, Rfl: 2   Omega-3 Fatty Acids (FISH OIL) 500 MG CAPS, Take by mouth., Disp: , Rfl:    rosuvastatin  (CRESTOR ) 20 MG tablet, Take 0.5-1 tablets (10-20 mg total) by mouth daily., Disp: 90 tablet, Rfl: 3  Imaging Review  DG Cervical Spine Complete  Narrative CLINICAL DATA:  Neck pain and spasms  EXAM: CERVICAL SPINE - COMPLETE 4+ VIEW  COMPARISON:  04/28/2023  FINDINGS: Normal alignment and prevertebral soft tissues. Mild degenerative disc disease changes at C3 through C7. These mid and lower cervical levels all demonstrate some degree of disc space narrowing, sclerosis  and bony spurring. Facets are aligned. No instability with flexion and extension. Intact odontoid. Trachea midline. Lung apices are clear.  IMPRESSION: Cervical degenerative changes as above. No acute finding by plain radiography.   Electronically Signed By: CHRISTELLA.  Shick M.D. On: 07/22/2024 11:46   DG Thoracic Spine 2 View  Narrative CLINICAL DATA:  Chronic back pain.  EXAM: THORACIC SPINE 2 VIEWS  COMPARISON:  Chest x-ray on 04/08/2023  FINDINGS: There is no evidence of thoracic spine fracture or subluxation. Mild diffuse degenerative disc disease, especially in the lower thoracic spine. No bony lesions or destruction.  IMPRESSION: Mild diffuse degenerative disc disease, especially in the lower thoracic spine.   Electronically Signed By: Marcey Moan M.D. On: 04/28/2023 10:09  MR LUMBAR SPINE WO CONTRAST  Narrative EXAM: MRI LUMBAR SPINE 07/19/2024 08:51:37 AM  TECHNIQUE: Multiplanar multisequence MRI of the lumbar spine was performed without the administration of intravenous contrast.  COMPARISON: Lumbar spine radiographs 04/28/2023.  CLINICAL HISTORY: Evaluate chronic LBP and leg pain. LBP RAD BILAT LE; X 2016, NKI, NO SX; HX RENAL CA 2018; NO INJECTIONS.  FINDINGS:  BONES AND ALIGNMENT: Levoconvex curvature of the lumbar spine centered at L5. Type 2 and type 3 Modic changes are present on the right at L2-3 and L3-4. Type 2 Modic changes are present on the right at L1-2.  SPINAL CORD:  Conus medullaris terminates at L1-2.  SOFT TISSUES: Scarring and parenchymal thinning is present at the lower pole of the left kidney.  L1-L2: Slight retrolisthesis present at L1-2. Uncovertebral broad-based disc protrusion is present. Asymmetric right-sided facet hypertrophy is present. Moderate right and mild left foraminal narrowing is present.  L2-L3: A broad-based disc protrusion is present. Asymmetric right-sided facet hypertrophy is present. Moderate  right foraminal narrowing is present.  L3-L4: A broad-based disc protrusion is present. Advanced facet hypertrophy is present on the right. Mild foraminal narrowing is worse on the right.  L4-L5: A broad-based disc protrusion is present. Asymmetric left-sided facet hypertrophy is present. Mild left subarticular and moderate left foraminal narrowing is present.  L5-S1: A broad-based disc protrusion is present. Moderate facet hypertrophy is worse on the left. Moderate left and mild right foraminal stenosis is present.  IMPRESSION: 1. Multilevel degenerative changes with broad-based disc protrusions and facet hypertrophy resulting in foraminal narrowing as described above.  Electronically signed by: Lonni Necessary MD 07/25/2024 12:50 PM EDT RP Workstation: [id number]  Narrative CLINICAL DATA:  Chronic left-sided lower back pain.  EXAM: LUMBAR SPINE - 2-3 VIEW  COMPARISON:  None.  FINDINGS: There is mild levoscoliosis. Moderate to advanced disc degeneration in the lumbar spine, with disc height loss and vacuum phenomenon primarily seen at L3-4 to L5-S1. Facet arthropathy with moderate to advanced hypertrophy at L3-4 and below. No evidence of fracture, endplate erosion, or bone lesion.  Extensive left-sided nephrolithiasis, including a 3 cm stone at the level of the hilum. Confluent stones in the lower pole with casting of a patulous caliceal system. Right-sided calcifications appear more ventral than expected for the kidney, which could be malpositioned.  IUD in place.  IMPRESSION: 1. Advanced for age disc and facet degeneration with levoscoliosis. No acute or focal osseous finding. 2. Extensive left nephrolithiasis, including 3 cm stone at the level of the pelvis. There is casting of the lower pole calyces which are dilated. 3. Probable the extensive right-sided nephrolithiasis, but not normally localizing to the right renal fossa on the lateral view, question  malpositioned right kidney. Recommend urology follow-up.   Electronically Signed By: Cassondra Roulette M.D. On: 09/18/2017 08:14  Lumbar DG (Complete) 4+V: Results for orders placed during the hospital encounter of 07/19/24  DG Lumbar Spine Complete  Narrative CLINICAL DATA:  Low back pain  EXAM: LUMBAR SPINE - COMPLETE 4+ VIEW  COMPARISON:  04/28/2023  FINDINGS: Interval worsening moderate to severe multilevel lower thoracic and lumbar degenerative disc disease with associated levoscoliosis measuring 28 degrees (Cobb angle) from T11-L4. Preserved vertebral body heights. No severe compression fracture, wedge-shaped deformity or focal kyphosis. No instability with limited flexion and extension. Aorta atherosclerotic. SI joints are maintained. Similar pattern of scattered subcentimeter left renal calculi.  IMPRESSION: 1. Interval worsening moderate to severe multilevel lower thoracic and lumbar degenerative disc disease with associated levoscoliosis. 2. No acute finding by plain radiography.   Electronically Signed By: CHRISTELLA.  Shick M.D. On: 07/22/2024 11:41  Complexity Note: Imaging results reviewed.                         ROS  Cardiovascular: High blood pressure Pulmonary or Respiratory: Snoring  and Coughing up mucus (Bronchitis) Neurological: Curved spine Psychological-Psychiatric: Depressed Gastrointestinal: Reflux or heatburn Genitourinary: Passing kidney stones Hematological: No reported hematological signs or symptoms such as prolonged bleeding, low or poor functioning platelets, bruising or bleeding easily, hereditary bleeding problems, low energy levels due to low  hemoglobin or being anemic Endocrine: No reported endocrine signs or symptoms such as high or low blood sugar, rapid heart rate due to high thyroid  levels, obesity or weight gain due to slow thyroid  or thyroid  disease Rheumatologic: No reported rheumatological signs and symptoms such as fatigue, joint  pain, tenderness, swelling, redness, heat, stiffness, decreased range of motion, with or without associated rash Musculoskeletal: Negative for myasthenia gravis, muscular dystrophy, multiple sclerosis or malignant hyperthermia Work History: Working part time  Allergies  Kristen Ortega has no known allergies.  Laboratory Chemistry Profile   Renal Lab Results  Component Value Date   BUN 17 04/13/2024   CREATININE 0.97 04/13/2024   BCR 18 04/13/2024   GFR 77.23 11/21/2021   SPECGRAV 1.015 04/13/2024   PHUR 6.0 04/13/2024   PROTEINUR 1+ (A) 04/13/2024     Electrolytes Lab Results  Component Value Date   NA 144 04/13/2024   K 3.9 04/13/2024   CL 104 04/13/2024   CALCIUM  9.1 04/13/2024     Hepatic Lab Results  Component Value Date   AST 22 04/13/2024   ALT 15 04/13/2024   ALBUMIN 4.3 04/13/2024   ALKPHOS 88 04/13/2024     ID Lab Results  Component Value Date   HIV NON-REACTIVE 11/29/2020   SARSCOV2NAA RESULT: NEGATIVE 02/09/2020     Bone Lab Results  Component Value Date   VD25OH 17.75 (L) 04/03/2023     Endocrine Lab Results  Component Value Date   GLUCOSE 87 04/13/2024   GLUCOSEU Negative 04/13/2024   HGBA1C 5.8 06/21/2024   TSH 1.31 06/21/2024     Neuropathy Lab Results  Component Value Date   HGBA1C 5.8 06/21/2024   HIV NON-REACTIVE 11/29/2020     CNS No results found for: COLORCSF, APPEARCSF, RBCCOUNTCSF, WBCCSF, POLYSCSF, LYMPHSCSF, EOSCSF, PROTEINCSF, GLUCCSF, JCVIRUS, CSFOLI, IGGCSF, LABACHR, ACETBL   Inflammation (CRP: Acute  ESR: Chronic) No results found for: CRP, ESRSEDRATE, LATICACIDVEN   Rheumatology No results found for: RF, ANA, LABURIC, URICUR, LYMEIGGIGMAB, LYMEABIGMQN, HLAB27   Coagulation Lab Results  Component Value Date   PLT 191 04/13/2024     Cardiovascular Lab Results  Component Value Date   HGB 14.3 04/13/2024   HCT 45.0 04/13/2024     Screening Lab Results   Component Value Date   SARSCOV2NAA RESULT: NEGATIVE 02/09/2020   HIV NON-REACTIVE 11/29/2020     Cancer No results found for: CEA, CA125, LABCA2   Allergens No results found for: ALMOND, APPLE, ASPARAGUS, AVOCADO, BANANA, BARLEY, BASIL, BAYLEAF, GREENBEAN, LIMABEAN, WHITEBEAN, BEEFIGE, REDBEET, BLUEBERRY, BROCCOLI, CABBAGE, MELON, CARROT, CASEIN, CASHEWNUT, CAULIFLOWER, CELERY     Note: Lab results reviewed.  PFSH  Drug: Kristen Ortega  reports no history of drug use. Alcohol:  reports current alcohol use. Tobacco:  reports that she has quit smoking. Her smoking use included cigarettes. She has never used smokeless tobacco. Medical:  has a past medical history of Anxiety, Arthritis (09/17/2017), Cancer (HCC) (12/2017), Depression, Hyperlipidemia, Hypertension, and Vaginal Pap smear, abnormal. Family: family history includes COPD in her mother; Cancer in her mother; Heart disease in her father; Hypertension in her father; Stroke in her paternal grandmother.  Past Surgical History:  Procedure Laterality Date   CESAREAN SECTION     x2   COLONOSCOPY  02/14/2020   COLPOSCOPY     KIDNEY SURGERY     kidney cancer removed 12-2017   WISDOM TOOTH EXTRACTION     Active Ambulatory Problems    Diagnosis Date Noted   Benign essential HTN 09/17/2017  Scoliosis of thoracolumbar spine 09/17/2017   Renal cell carcinoma (HCC) 01/29/2020   Prediabetes 01/31/2020   Dyslipidemia 01/31/2020   Chronic back pain 03/28/2020   Nephrolithiasis 12/25/2021   Gross hematuria 02/01/2022   Lumbar spondylosis 09/21/2024   Chronic radicular lumbar pain 09/21/2024   Chronic pain syndrome 09/21/2024   Resolved Ambulatory Problems    Diagnosis Date Noted   No Resolved Ambulatory Problems   Past Medical History:  Diagnosis Date   Anxiety    Arthritis 09/17/2017   Cancer (HCC) 12/2017   Depression    Hyperlipidemia    Hypertension    Vaginal Pap smear,  abnormal    Constitutional Exam  General appearance: Well nourished, well developed, and well hydrated. In no apparent acute distress Vitals:   09/21/24 1323  BP: (!) 169/88  Pulse: 60  Resp: 16  Temp: (!) 97.2 F (36.2 C)  TempSrc: Temporal  SpO2: 98%  Weight: 215 lb (97.5 kg)  Height: 5' 4 (1.626 m)   BMI Assessment: Estimated body mass index is 36.9 kg/m as calculated from the following:   Height as of this encounter: 5' 4 (1.626 m).   Weight as of this encounter: 215 lb (97.5 kg).  BMI interpretation table: BMI level Category Range association with higher incidence of chronic pain  <18 kg/m2 Underweight   18.5-24.9 kg/m2 Ideal body weight   25-29.9 kg/m2 Overweight Increased incidence by 20%  30-34.9 kg/m2 Obese (Class I) Increased incidence by 68%  35-39.9 kg/m2 Severe obesity (Class II) Increased incidence by 136%  >40 kg/m2 Extreme obesity (Class III) Increased incidence by 254%   Patient's current BMI Ideal Body weight  Body mass index is 36.9 kg/m. Ideal body weight: 54.7 kg (120 lb 9.5 oz) Adjusted ideal body weight: 71.8 kg (158 lb 5.7 oz)   BMI Readings from Last 4 Encounters:  09/21/24 36.90 kg/m  08/11/24 35.87 kg/m  07/05/24 35.87 kg/m  06/21/24 36.63 kg/m   Wt Readings from Last 4 Encounters:  09/21/24 215 lb (97.5 kg)  08/11/24 209 lb (94.8 kg)  07/05/24 209 lb (94.8 kg)  06/21/24 213 lb 6.4 oz (96.8 kg)    Psych/Mental status: Alert, oriented x 3 (person, place, & time)       Eyes: PERLA Respiratory: No evidence of acute respiratory distress  Lumbar Spine Area Exam  Skin & Axial Inspection: No masses, redness, or swelling Alignment: Symmetrical Functional ROM: Pain restricted ROM affecting both sides Stability: No instability detected Muscle Tone/Strength: Functionally intact. No obvious neuro-muscular anomalies detected. Sensory (Neurological): Musculoskeletal pain pattern Palpation: No palpable anomalies       Provocative  Tests: Hyperextension/rotation test: (+) bilaterally for facet joint pain. Lumbar quadrant test (Kemp's test): (+) bilaterally for facet joint pain.   Gait & Posture Assessment  Ambulation: Unassisted Gait: Relatively normal for age and body habitus Posture: WNL  Lower Extremity Exam    Side: Right lower extremity  Side: Left lower extremity  Stability: No instability observed          Stability: No instability observed          Skin & Extremity Inspection: Skin color, temperature, and hair growth are WNL. No peripheral edema or cyanosis. No masses, redness, swelling, asymmetry, or associated skin lesions. No contractures.  Skin & Extremity Inspection: Skin color, temperature, and hair growth are WNL. No peripheral edema or cyanosis. No masses, redness, swelling, asymmetry, or associated skin lesions. No contractures.  Functional ROM: Unrestricted ROM  Functional ROM: Unrestricted ROM                  Muscle Tone/Strength: Functionally intact. No obvious neuro-muscular anomalies detected.  Muscle Tone/Strength: Functionally intact. No obvious neuro-muscular anomalies detected.  Sensory (Neurological): Unimpaired        Sensory (Neurological): Unimpaired        DTR: Patellar: deferred today Achilles: deferred today Plantar: deferred today  DTR: Patellar: deferred today Achilles: deferred today Plantar: deferred today  Palpation: No palpable anomalies  Palpation: No palpable anomalies    Assessment  Primary Diagnosis & Pertinent Problem List: The primary encounter diagnosis was Lumbar facet arthropathy. Diagnoses of Lumbar spondylosis, Chronic radicular lumbar pain, and Chronic pain syndrome were also pertinent to this visit.  Visit Diagnosis (New problems to examiner): 1. Lumbar facet arthropathy   2. Lumbar spondylosis   3. Chronic radicular lumbar pain   4. Chronic pain syndrome    Plan of Care (Initial workup plan)     Procedure Orders         LUMBAR  FACET(MEDIAL BRANCH NERVE BLOCK) MBNB    Pharmacotherapy (current): Medications ordered:  No orders of the defined types were placed in this encounter.  Medications administered during this visit: Kristen Ortega had no medications administered during this visit.   Analgesic Pharmacotherapy:  Opioid Analgesics: For patients currently taking or requesting to take opioid analgesics, in accordance with [place]  Medical Board Guidelines, we will assess their risks and indications for the use of these substances. After completing our evaluation, we may offer recommendations, but we no longer take patients for medication management. The prescribing physician will ultimately decide, based on his/her training and level of comfort whether to adopt any of the recommendations, including whether or not to prescribe such medicines.  Membrane stabilizer: To be determined at a later time  Muscle relaxant: To be determined at a later time  NSAID: To be determined at a later time  Other analgesic(s): To be determined at a later time      Provider-requested follow-up: Return in about 6 days (around 09/27/2024) for B/L L3, 4, 5 MBNB #1, in clinic NS.  Future Appointments  Date Time Provider Department Center  10/11/2024  9:30 AM Hilma Hastings, PA-C [facility]  04/13/2025 10:00 AM Stoneking, Adine PARAS., MD [facility] None   I discussed the assessment and treatment plan with the patient. The patient was provided an opportunity to ask questions and all were answered. The patient agreed with the plan and demonstrated an understanding of the instructions.  Patient advised to call back or seek an in-person evaluation if the symptoms or condition worsens.  I personally spent a total of 60 minutes in the care of the patient today including preparing to see the patient, getting/reviewing separately obtained history, performing a medically appropriate exam/evaluation, counseling and educating, placing orders,  and documenting clinical information in the EHR.   Note by: Wallie Sherry, MD (TTS and AI technology used. I apologize for any typographical errors that were not detected and corrected.) Date: 09/21/2024; Time: 1:57 PM

## 2024-09-21 NOTE — Patient Instructions (Signed)
 ______________________________________________________________________    General Risks and Possible Complications  Patient Responsibilities: It is important that you read this as it is part of your informed consent. It is our duty to inform you of the risks and possible complications associated with treatments offered to you. It is your responsibility as a patient to read this and to ask questions about anything that is not clear or that you believe was not covered in this document.  Patient's Rights: You have the right to refuse treatment. You also have the right to change your mind, even after initially having agreed to have the treatment done. However, under this last option, if you wait until the last second to change your mind, you may be charged for the materials used up to that point.  Introduction: Medicine is not an Visual merchandiser. Everything in Medicine, including the lack of treatment(s), carries the potential for danger, harm, or loss (which is by definition: Risk). In Medicine, a complication is a secondary problem, condition, or disease that can aggravate an already existing one. All treatments carry the risk of possible complications. The fact that a side effects or complications occurs, does not imply that the treatment was conducted incorrectly. It must be clearly understood that these can happen even when everything is done following the highest safety standards.  No treatment: You can choose not to proceed with the proposed treatment alternative. The "PRO(s)" would include: avoiding the risk of complications associated with the therapy. The "CON(s)" would include: not getting any of the treatment benefits. These benefits fall under one of three categories: diagnostic; therapeutic; and/or palliative. Diagnostic benefits include: getting information which can ultimately lead to improvement of the disease or symptom(s). Therapeutic benefits are those associated with the successful  treatment of the disease. Finally, palliative benefits are those related to the decrease of the primary symptoms, without necessarily curing the condition (example: decreasing the pain from a flare-up of a chronic condition, such as incurable terminal cancer).  General Risks and Complications: These are associated to most interventional treatments. They can occur alone, or in combination. They fall under one of the following six (6) categories: no benefit or worsening of symptoms; bleeding; infection; nerve damage; allergic reactions; and/or death. No benefits or worsening of symptoms: In Medicine there are no guarantees, only probabilities. No healthcare provider can ever guarantee that a medical treatment will work, they can only state the probability that it may. Furthermore, there is always the possibility that the condition may worsen, either directly, or indirectly, as a consequence of the treatment. Bleeding: This is more common if the patient is taking a blood thinner, either prescription or over the counter (example: Goody Powders, Fish oil, Aspirin, Garlic, etc.), or if suffering a condition associated with impaired coagulation (example: Hemophilia, cirrhosis of the liver, low platelet counts, etc.). However, even if you do not have one on these, it can still happen. If you have any of these conditions, or take one of these drugs, make sure to notify your treating physician. Infection: This is more common in patients with a compromised immune system, either due to disease (example: diabetes, cancer, human immunodeficiency virus [HIV], etc.), or due to medications or treatments (example: therapies used to treat cancer and rheumatological diseases). However, even if you do not have one on these, it can still happen. If you have any of these conditions, or take one of these drugs, make sure to notify your treating physician. Nerve Damage: This is more common when the treatment is  an invasive one, but it  can also happen with the use of medications, such as those used in the treatment of cancer. The damage can occur to small secondary nerves, or to large primary ones, such as those in the spinal cord and brain. This damage may be temporary or permanent and it may lead to impairments that can range from temporary numbness to permanent paralysis and/or brain death. Allergic Reactions: Any time a substance or material comes in contact with our body, there is the possibility of an allergic reaction. These can range from a mild skin rash (contact dermatitis) to a severe systemic reaction (anaphylactic reaction), which can result in death. Death: In general, any medical intervention can result in death, most of the time due to an unforeseen complication. ______________________________________________________________________      ______________________________________________________________________    Preparing for your procedure  Appointments: If you think you may not be able to keep your appointment, call 24-48 hours in advance to cancel. We need time to make it available to others.  Procedure visits are for procedures only. During your procedure appointment there will be: NO Prescription Refills*. NO medication changes or discussions*. NO discussion of disability issues*. NO unrelated pain problem evaluations*. NO evaluations to order other pain procedures*. *These will be addressed at a separate and distinct evaluation encounter on the provider's evaluation schedule and not during procedure days.  Instructions: Food intake: Avoid eating anything solid for at least 8 hours prior to your procedure. Clear liquid intake: You may take clear liquids such as water up to 2 hours prior to your procedure. (No carbonated drinks. No soda.) Transportation: Unless otherwise stated by your physician, bring a driver. (Driver cannot be a Market researcher, Pharmacist, community, or any other form of public transportation.) Morning  Medicines: Except for blood thinners, take all of your other morning medications with a sip of water. Make sure to take your heart and blood pressure medicines. If your blood pressure's lower number is above 100, the case will be rescheduled. Blood thinners: Make sure to stop your blood thinners as instructed.  If you take a blood thinner, but were not instructed to stop it, call our office [phone] and ask to talk to a nurse. Not stopping a blood thinner prior to certain procedures could lead to serious complications. Diabetics on insulin: Notify the staff so that you can be scheduled 1st case in the morning. If your diabetes requires high dose insulin, take only  of your normal insulin dose the morning of the procedure and notify the staff that you have done so. Preventing infections: Shower with an antibacterial soap the morning of your procedure.  Build-up your immune system: Take 1000 mg of Vitamin C with every meal (3 times a day) the day prior to your procedure. Antibiotics: Inform the nursing staff if you are taking any antibiotics or if you have any conditions that may require antibiotics prior to procedures. (Example: recent joint implants)   Pregnancy: If you are pregnant make sure to notify the nursing staff. Not doing so may result in injury to the fetus, including death.  Sickness: If you have a cold, fever, or any active infections, call and cancel or reschedule your procedure. Receiving steroids while having an infection may result in complications. Arrival: You must be in the facility at least 30 minutes prior to your scheduled procedure. Tardiness: Your scheduled time is also the cutoff time. If you do not arrive at least 15 minutes prior to your procedure, you will  be rescheduled.  Children: Do not bring any children with you. Make arrangements to keep them home. Dress appropriately: There is always a possibility that your clothing may get soiled. Avoid long dresses. Valuables:  Do not bring any jewelry or valuables.  Reasons to call and reschedule or cancel your procedure: (Following these recommendations will minimize the risk of a serious complication.) Surgeries: Avoid having procedures within 2 weeks of any surgery. (Avoid for 2 weeks before or after any surgery). Flu Shots: Avoid having procedures within 2 weeks of a flu shots or . (Avoid for 2 weeks before or after immunizations). Barium: Avoid having a procedure within 7-10 days after having had a radiological study involving the use of radiological contrast. (Myelograms, Barium swallow or enema study). Heart attacks: Avoid any elective procedures or surgeries for the initial 6 months after a "Myocardial Infarction" (Heart Attack). Blood thinners: It is imperative that you stop these medications before procedures. Let us know if you if you take any blood thinner.  Infection: Avoid procedures during or within two weeks of an infection (including chest colds or gastrointestinal problems). Symptoms associated with infections include: Localized redness, fever, chills, night sweats or profuse sweating, burning sensation when voiding, cough, congestion, stuffiness, runny nose, sore throat, diarrhea, nausea, vomiting, cold or Flu symptoms, recent or current infections. It is specially important if the infection is over the area that we intend to treat. Heart and lung problems: Symptoms that may suggest an active cardiopulmonary problem include: cough, chest pain, breathing difficulties or shortness of breath, dizziness, ankle swelling, uncontrolled high or unusually low blood pressure, and/or palpitations. If you are experiencing any of these symptoms, cancel your procedure and contact your primary care physician for an evaluation.  Remember:  Regular Business hours are:  Monday to Thursday 8:00 AM to 4:00 PM  Provider's Schedule: Delano Metz, MD:  Procedure days: Tuesday and Thursday 7:30 AM to 4:00 PM  Edward Jolly, MD:  Procedure days: Monday and Wednesday 7:30 AM to 4:00 PM Last  Updated: 11/25/2023 ______________________________________________________________________     Facet Blocks Patient Information  Description: The facets are joints in the spine between the vertebrae.  Like any joints in the body, facets can become irritated and painful.  Arthritis can also effect the facets.  By injecting steroids and local anesthetic in and around these joints, we can temporarily block the nerve supply to them.  Steroids act directly on irritated nerves and tissues to reduce selling and inflammation which often leads to decreased pain.  Facet blocks may be done anywhere along the spine from the neck to the low back depending upon the location of your pain.   After numbing the skin with local anesthetic (like Novocaine), a small needle is passed onto the facet joints under x-ray guidance.  You may experience a sensation of pressure while this is being done.  The entire block usually lasts about 15-25 minutes.   Conditions which may be treated by facet blocks:  Low back/buttock pain Neck/shoulder pain Certain types of headaches  Preparation for the injection:  Do not eat any solid food or dairy products within 8 hours of your appointment. You may drink clear liquid up to 3 hours before appointment.  Clear liquids include water, black coffee, juice or soda.  No milk or cream please. You may take your regular medication, including pain medications, with a sip of water before your appointment.  Diabetics should hold regular insulin (if taken separately) and take 1/2 normal NPH dose  the morning of the procedure.  Carry some sugar containing items with you to your appointment. A driver must accompany you and be prepared to drive you home after your procedure. Bring all your current medications with you. An IV may be inserted and sedation may be given at the discretion of the physician. A blood pressure  cuff, EKG and other monitors will often be applied during the procedure.  Some patients may need to have extra oxygen administered for a short period. You will be asked to provide medical information, including your allergies and medications, prior to the procedure.  We must know immediately if you are taking blood thinners (like Coumadin/Warfarin) or if you are allergic to IV iodine contrast (dye).  We must know if you could possible be pregnant.  Possible side-effects:  Bleeding from needle site Infection (rare, may require surgery) Nerve injury (rare) Numbness & tingling (temporary) Difficulty urinating (rare, temporary) Spinal headache (a headache worse with upright posture) Light-headedness (temporary) Pain at injection site (serveral days) Decreased blood pressure (rare, temporary) Weakness in arm/leg (temporary) Pressure sensation in back/neck (temporary)   Call if you experience:  Fever/chills associated with headache or increased back/neck pain Headache worsened by an upright position New onset, weakness or numbness of an extremity below the injection site Hives or difficulty breathing (go to the emergency room) Inflammation or drainage at the injection site(s) Severe back/neck pain greater than usual New symptoms which are concerning to you  Please note:  Although the local anesthetic injected can often make your back or neck feel good for several hours after the injection, the pain will likely return. It takes 3-7 days for steroids to work.  You may not notice any pain relief for at least one week.  If effective, we will often do a series of 2-3 injections spaced 3-6 weeks apart to maximally decrease your pain.  After the initial series, you may be a candidate for a more permanent nerve block of the facets.  If you have any questions, please call [phone] [facility]

## 2024-09-27 ENCOUNTER — Ambulatory Visit
Admission: RE | Admit: 2024-09-27 | Discharge: 2024-09-27 | Disposition: A | Discharge status: Home / Self Care | Source: Ambulatory Visit | Attending: Student in an Organized Health Care Education/Training Program | Admitting: Student in an Organized Health Care Education/Training Program

## 2024-09-27 ENCOUNTER — Ambulatory Visit (HOSPITAL_BASED_OUTPATIENT_CLINIC_OR_DEPARTMENT_OTHER): Admitting: Student in an Organized Health Care Education/Training Program

## 2024-09-27 ENCOUNTER — Encounter: Payer: Self-pay | Admitting: Student in an Organized Health Care Education/Training Program

## 2024-09-27 DIAGNOSIS — M47816 Spondylosis without myelopathy or radiculopathy, lumbar region: Secondary | ICD-10-CM | POA: Insufficient documentation

## 2024-09-27 MED ORDER — DEXAMETHASONE SOD PHOSPHATE PF 10 MG/ML IJ SOLN
20.0000 mg | Freq: Once | INTRAMUSCULAR | Status: AC
  Administered 2024-09-27: 20 mg

## 2024-09-27 MED ORDER — LIDOCAINE HCL 2 % IJ SOLN
20.0000 mL | Freq: Once | INTRAMUSCULAR | Status: AC
  Administered 2024-09-27: 400 mg
  Filled 2024-09-27: qty 40

## 2024-09-27 MED ORDER — ROPIVACAINE HCL 2 MG/ML IJ SOLN
18.0000 mL | Freq: Once | INTRAMUSCULAR | Status: AC
  Administered 2024-09-27: 18 mL via PERINEURAL
  Filled 2024-09-27: qty 20

## 2024-09-27 NOTE — Patient Instructions (Signed)

## 2024-09-27 NOTE — Progress Notes (Signed)
 PROVIDER NOTE: Interpretation of information contained herein should be left to medically-trained personnel. Specific patient instructions are provided elsewhere under Patient Instructions section of medical record. This document was created in part using STT-dictation technology, any transcriptional errors that may result from this process are unintentional.  Patient: Kristen Ortega Type: Established DOB: 1966-12-12 MRN: [id number] PCP: Watt Harlene BROCKS, MD  Service: Procedure DOS: 09/27/2024 Setting: Ambulatory Location: Ambulatory outpatient facility Delivery: Face-to-face Provider: Wallie Sherry, MD Specialty: Interventional Pain Management Specialty designation: 09 Location: Outpatient facility Ref. Prov.: Sherry Wallie, MD       Interventional Therapy   Type: Lumbar Facet, Medial Branch Block(s) (w/ fluoroscopic mapping) #1  Laterality: Bilateral  Level: L3, L4, and L5 Medial Branch/Dorsal Rami Level(s). Injecting these levels blocks the L3-4 and L4-5 lumbar facet joints. Imaging: Fluoroscopic guidance Spinal ([id number]) Anesthesia: Local anesthesia (1-2% Lidocaine) DOS: 09/27/2024 Performed by: Wallie Sherry, MD  Primary Purpose: Diagnostic/Therapeutic Indications: Low back pain severe enough to impact quality of life or function. 1. Lumbar facet arthropathy   2. Lumbar spondylosis    NAS-11 Pain score:   Pre-procedure: 5 /10   Post-procedure: 5 /10     Position / Prep / Materials:  Position: Prone  Prep solution: ChloraPrep (2% chlorhexidine gluconate and 70% isopropyl alcohol) Area Prepped: Posterolateral Lumbosacral Spine (Wide prep: From the lower border of the scapula down to the end of the tailbone and from flank to flank.)  Materials:  Tray: Block Needle(s):  Type: Spinal  Gauge (G): 22  Length: 5-in Qty: 3     H&P (Pre-op Assessment):  Kristen Ortega is a 58 y.o. (year old), female patient, seen today for interventional treatment. She  has a past surgical  history that includes Colposcopy; Cesarean section; Kidney surgery; Wisdom tooth extraction; and Colonoscopy (02/14/2020). Kristen Ortega has a current medication list which includes the following prescription(s): albuterol , calcium  carbonate, diphenhydramine, escitalopram , famotidine , nystatin -triamcinolone , fish oil, and rosuvastatin . Her primarily concern today is the Back Pain (lower)  Initial Vital Signs:  Pulse/HCG Rate: 62ECG Heart Rate: (!) 59 Temp: (!) 97.5 F (36.4 C) Resp: 16 BP: (!) 162/68 SpO2: 98 %  BMI: Estimated body mass index is 36.39 kg/m as calculated from the following:   Height as of this encounter: 5' 4 (1.626 m).   Weight as of this encounter: 212 lb (96.2 kg).  Risk Assessment: Allergies: Reviewed. She has no known allergies.  Allergy Precautions: None required Coagulopathies: Reviewed. None identified.  Blood-thinner therapy: None at this time Active Infection(s): Reviewed. None identified. Kristen Ortega is afebrile  Site Confirmation: Kristen Ortega was asked to confirm the procedure and laterality before marking the site Procedure checklist: Completed Consent: Before the procedure and under the influence of no sedative(s), amnesic(s), or anxiolytics, the patient was informed of the treatment options, risks and possible complications. To fulfill our ethical and legal obligations, as recommended by the American Medical Association's Code of Ethics, I have informed the patient of my clinical impression; the nature and purpose of the treatment or procedure; the risks, benefits, and possible complications of the intervention; the alternatives, including doing nothing; the risk(s) and benefit(s) of the alternative treatment(s) or procedure(s); and the risk(s) and benefit(s) of doing nothing. The patient was provided information about the general risks and possible complications associated with the procedure. These may include, but are not limited to: failure to achieve desired  goals, infection, bleeding, organ or nerve damage, allergic reactions, paralysis, and death. In addition, the patient was informed of those risks and  complications associated to Spine-related procedures, such as failure to decrease pain; infection (i.e.: Meningitis, epidural or intraspinal abscess); bleeding (i.e.: epidural hematoma, subarachnoid hemorrhage, or any other type of intraspinal or peri-dural bleeding); organ or nerve damage (i.e.: Any type of peripheral nerve, nerve root, or spinal cord injury) with subsequent damage to sensory, motor, and/or autonomic systems, resulting in permanent pain, numbness, and/or weakness of one or several areas of the body; allergic reactions; (i.e.: anaphylactic reaction); and/or death. Furthermore, the patient was informed of those risks and complications associated with the medications. These include, but are not limited to: allergic reactions (i.e.: anaphylactic or anaphylactoid reaction(s)); adrenal axis suppression; blood sugar elevation that in diabetics may result in ketoacidosis or comma; water retention that in patients with history of congestive heart failure may result in shortness of breath, pulmonary edema, and decompensation with resultant heart failure; weight gain; swelling or edema; medication-induced neural toxicity; particulate matter embolism and blood vessel occlusion with resultant organ, and/or nervous system infarction; and/or aseptic necrosis of one or more joints. Finally, the patient was informed that Medicine is not an exact science; therefore, there is also the possibility of unforeseen or unpredictable risks and/or possible complications that may result in a catastrophic outcome. The patient indicated having understood very clearly. We have given the patient no guarantees and we have made no promises. Enough time was given to the patient to ask questions, all of which were answered to the patient's satisfaction. Kristen Ortega has indicated that  she wanted to continue with the procedure. Attestation: I, the ordering provider, attest that I have discussed with the patient the benefits, risks, side-effects, alternatives, likelihood of achieving goals, and potential problems during recovery for the procedure that I have provided informed consent. Date  Time: 09/27/2024 10:57 AM  Pre-Procedure Preparation:  Monitoring: As per clinic protocol. Respiration, ETCO2, SpO2, BP, heart rate and rhythm monitor placed and checked for adequate function Safety Precautions: Patient was assessed for positional comfort and pressure points before starting the procedure. Time-out: I initiated and conducted the Time-out before starting the procedure, as per protocol. The patient was asked to participate by confirming the accuracy of the Time Out information. Verification of the correct person, site, and procedure were performed and confirmed by me, the nursing staff, and the patient. Time-out conducted as per Joint Commission's Universal Protocol (UP.01.01.01). Time: 1152 Start Time: 1152 hrs.  Description of Procedure:          Laterality: (see above) Targeted Levels: (see above)  Safety Precautions: Aspiration looking for blood return was conducted prior to all injections. At no point did we inject any substances, as a needle was being advanced. Before injecting, the patient was told to immediately notify me if she was experiencing any new onset of ringing in the ears, or metallic taste in the mouth. No attempts were made at seeking any paresthesias. Safe injection practices and needle disposal techniques used. Medications properly checked for expiration dates. SDV (single dose vial) medications used. After the completion of the procedure, all disposable equipment used was discarded in the proper designated medical waste containers. Local Anesthesia: Protocol guidelines were followed. The patient was positioned over the fluoroscopy table. The area was  prepped in the usual manner. The time-out was completed. The target area was identified using fluoroscopy. A 12-in long, straight, sterile hemostat was used with fluoroscopic guidance to locate the targets for each level blocked. Once located, the skin was marked with an approved surgical skin marker. Once all sites were  marked, the skin (epidermis, dermis, and hypodermis), as well as deeper tissues (fat, connective tissue and muscle) were infiltrated with a small amount of a short-acting local anesthetic, loaded on a 10cc syringe with a 25G, 1.5-in  Needle. An appropriate amount of time was allowed for local anesthetics to take effect before proceeding to the next step. Local Anesthetic: Lidocaine 2.0% The unused portion of the local anesthetic was discarded in the proper designated containers. Technical description of process:  Medial Branch  Dorsal Rami Nerve Block (MBB):  Neuroanatomy note: Each lumbar facet joint receives dual innervation from medial branches arising from the posterior primary rami at the same level and one level above. The target for each lumbar medial branch is the junction of the ipsilateral superior articular and transverse process of the lower vertebral body. (i.e.: The L4-L5 facet joint is innervated by the L4 medial branch [located at L5] and the L3 medial branch [located at L4]. Blocking the L4 Medial Branch is therefore achieved by injecting at the junction of the ipsilateral superior articular and transverse process of the lower vertebral body [L5].).  Exception: The exception to the above rule is the L5-S1 facet joint which has triple innervation requiring the L4 medial branch, as well as the L5 and the S1 Dorsal Rami(s) to be blocked to fully denervate the joint.  Under fluoroscopic guidance, a needle was inserted until contact was made with os over the target area. After negative aspiration, 2mL of the nerve block solution was injected without difficulty or complication.  Paresthesia were avoided during injection. The needle(s) were removed intact and without complication.  Once the entire procedure was completed, the treated area was cleaned, making sure to leave some of the prepping solution back to take advantage of its long term bactericidal properties.         Illustration of the posterior view of the lumbar spine and the posterior neural structures. Laminae of L2 through S1 are labeled. DPRL5, dorsal primary ramus of L5; DPRS1, dorsal primary ramus of S1; DPR3, dorsal primary ramus of L3; FJ, facet (zygapophyseal) joint L3-L4; I, inferior articular process of L4; LB1, lateral branch of dorsal primary ramus of L1; IAB, inferior articular branches from L3 medial branch (supplies L4-L5 facet joint); IBP, intermediate branch plexus; MB3, medial branch of dorsal primary ramus of L3; NR3, third lumbar nerve root; S, superior articular process of L5; SAB, superior articular branches from L4 (supplies L4-5 facet joint also); TP3, transverse process of L3.   Facet Joint Innervation (* possible contribution)  L1-2 T12, L1 (L2*)  Medial Branch  L2-3 L1, L2 (L3*)                     L3-4 L2, L3 (L4*)                     L4-5 L3, L4 (L5*)                     L5-S1 L4, L5, S1                        Vitals:   09/27/24 1147 09/27/24 1153 09/27/24 1157 09/27/24 1159  BP: (!) 165/95 (!) 151/107 (!) 160/97 (!) 151/84  Pulse:      Resp: (!) 22 (!) 22 (!) 23 (!) 25  Temp:      SpO2: 97% 97% 97% 96%  Weight:      Height:  End Time: 1157 hrs.  Imaging Guidance (Spinal):         Type of Imaging Technique: Fluoroscopy Guidance (Spinal) Indication(s): Fluoroscopy guidance for needle placement to enhance accuracy in procedures requiring precise needle localization for targeted delivery of medication in or near specific anatomical locations not easily accessible without such real-time imaging assistance. Exposure Time: Please see nurses notes. Contrast:  None used. Fluoroscopic Guidance: I was personally present during the use of fluoroscopy. Tunnel Vision Technique used to obtain the best possible view of the target area. Parallax error corrected before commencing the procedure. Direction-depth-direction technique used to introduce the needle under continuous pulsed fluoroscopy. Once target was reached, antero-posterior, oblique, and lateral fluoroscopic projection used confirm needle placement in all planes. Images permanently stored in EMR. Interpretation: No contrast injected. I personally interpreted the imaging intraoperatively. Adequate needle placement confirmed in multiple planes. Permanent images saved into the patient's record.  Post-operative Assessment:  Post-procedure Vital Signs:  Pulse/HCG Rate: 62(!) 59 Temp: (!) 97.5 F (36.4 C) Resp: (!) 25 BP: (!) 151/84 SpO2: 96 %  EBL: None  Complications: No immediate post-treatment complications observed by team, or reported by patient.  Note: The patient tolerated the entire procedure well. A repeat set of vitals were taken after the procedure and the patient was kept under observation following institutional policy, for this type of procedure. Post-procedural neurological assessment was performed, showing return to baseline, prior to discharge. The patient was provided with post-procedure discharge instructions, including a section on how to identify potential problems. Should any problems arise concerning this procedure, the patient was given instructions to immediately contact us , at any time, without hesitation. In any case, we plan to contact the patient by telephone for a follow-up status report regarding this interventional procedure.  Comments:  No additional relevant information.  Plan of Care (POC)  Orders:  Orders Placed This Encounter  Procedures   DG PAIN CLINIC C-ARM 1-60 MIN NO REPORT    Intraoperative interpretation by procedural physician at [facility].    Standing Status:   Standing    Number of Occurrences:   1    Reason for exam::   Assistance in needle guidance and placement for procedures requiring needle placement in or near specific anatomical locations not easily accessible without such assistance.     Medications ordered for procedure: Meds ordered this encounter  Medications   lidocaine (XYLOCAINE) 2 % (with pres) injection 400 mg   ropivacaine (PF) 2 mg/mL (0.2%) (NAROPIN) injection 18 mL   dexamethasone (DECADRON) injection 20 mg   Medications administered: We administered lidocaine, ropivacaine (PF) 2 mg/mL (0.2%), and dexamethasone.  See the medical record for exact dosing, route, and time of administration.    B/L L3,4,5 MBNB 09/27/24    Follow-up plan:   Return in about 2 weeks (around 10/11/2024) for PPE, F2F.     Recent Visits Date Type Provider Dept  09/21/24 Office Visit Marcelino Nurse, MD [facility]  Showing recent visits within past 90 days and meeting all other requirements Today's Visits Date Type Provider Dept  09/27/24 Procedure visit Marcelino Nurse, MD [facility]  Showing today's visits and meeting all other requirements Future Appointments No visits were found meeting these conditions. Showing future appointments within next 90 days and meeting all other requirements   Disposition: Discharge home  Discharge (Date  Time): 09/27/2024; 1202 hrs.   Primary Care Physician: Watt Harlene BROCKS, MD Location: [facility] Note by: Nurse Marcelino,  MD (TTS technology used. I apologize for any typographical errors that were not detected and corrected.) Date: 09/27/2024; Time: 12:04 PM  Disclaimer:  Medicine is not an Visual merchandiser. The only guarantee in medicine is that nothing is guaranteed. It is important to note that the decision to proceed with this intervention was based on the information collected from the patient. The Data and conclusions  were drawn from the patient's questionnaire, the interview, and the physical examination. Because the information was provided in large part by the patient, it cannot be guaranteed that it has not been purposely or unconsciously manipulated. Every effort has been made to obtain as much relevant data as possible for this evaluation. It is important to note that the conclusions that lead to this procedure are derived in large part from the available data. Always take into account that the treatment will also be dependent on availability of resources and existing treatment guidelines, considered by other Pain Management Practitioners as being common knowledge and practice, at the time of the intervention. For Medico-Legal purposes, it is also important to point out that variation in procedural techniques and pharmacological choices are the acceptable norm. The indications, contraindications, technique, and results of the above procedure should only be interpreted and judged by a Board-Certified Interventional Pain Specialist with extensive familiarity and expertise in the same exact procedure and technique.

## 2024-09-28 ENCOUNTER — Telehealth: Payer: Self-pay | Admitting: *Deleted

## 2024-09-28 NOTE — Telephone Encounter (Signed)
 Post procedure call;  no concerns.

## 2024-10-08 NOTE — Progress Notes (Deleted)
 Referring Physician:  Watt Harlene BROCKS, MD [address] [place],  [place] [address]  Primary Physician:  Watt Harlene BROCKS, MD  History of Present Illness: [name] has a history of HTN, scoliosis, renal cell carcinoma s/p partial nephrectomy, prediabetes, dyslipidemia.   She has seen Dr. Bonner in the past and had lumbar ESI. No previous improvement with PT.   Last seen by me on 08/11/24 for intermittent stiffness in her neck along with chronic intermittent LBP with intermittent leg pain.   She has known cervical spondylosis with mild DDD.   She has known moderate lumbar spondylosis and DDD with significant TL scoliosis. She has multilevel bilateral foraminal stenosis.   She declined PT at her last visit. She wanted to hold on further treatment for her neck. She was sent to pain management for her back.   She had bilateral L3-L5 MBB on 09/27/24 with Dr. Marcelino.   She is here for follow up.       She continues with intermittent stiffness in her neck with limited ROM. She has intermittent right shoulder pain into her arm  to her elbow with numbness. No weakness. No pain in left arm.   Also with chronic intermittent chronic LBP that is worse with walking x years. She has intermittent left lateral leg pain to her knee when pain is worse. Some improvement when she stops walking to sit. Some relief with heat. She feels weakness in left leg especially when doing stairs.   Tobacco use: Does not smoke.   Bowel/Bladder Dysfunction: none  Conservative measures:  Physical therapy:  has not participated in recently Multimodal medical therapy including regular antiinflammatories: Tramadol , Flexeril  Injections:  09/27/24: bilateral L3-L5 MBB (Lateef) 02/17/2018 ESI L5-S1 by Dr. Bonner  Past Surgery: no spine surgery  Kristen Ortega has no symptoms of cervical myelopathy.  The symptoms are causing a significant impact on the patient's life.   Review of  Systems:  A 10 point review of systems is negative, except for the pertinent positives and negatives detailed in the HPI.  Past Medical History: Past Medical History:  Diagnosis Date   Anxiety    Arthritis 09/17/2017   Cancer (HCC) 12/2017   kidney cancer   Depression    Hyperlipidemia    Hypertension    Vaginal Pap smear, abnormal     Past Surgical History: Past Surgical History:  Procedure Laterality Date   CESAREAN SECTION     x2   COLONOSCOPY  02/14/2020   COLPOSCOPY     KIDNEY SURGERY     kidney cancer removed 12-2017   WISDOM TOOTH EXTRACTION      Allergies: Allergies as of 10/11/2024   (No Known Allergies)    Medications: Outpatient Encounter Medications as of 10/11/2024  Medication Sig   albuterol  (VENTOLIN  HFA) 108 (90 Base) MCG/ACT inhaler Inhale 2 puffs into the lungs every 6 (six) hours as needed for wheezing or shortness of breath.   calcium  carbonate (TUMS - DOSED IN MG ELEMENTAL CALCIUM ) 500 MG chewable tablet Chew 1 tablet by mouth daily.   diphenhydrAMINE (BENADRYL) 50 MG tablet Take 50 mg by mouth at bedtime as needed for itching.   escitalopram  (LEXAPRO ) 20 MG tablet Take 1 tablet (20 mg total) by mouth daily.   famotidine  (PEPCID ) 20 MG tablet Take by mouth.   nystatin -triamcinolone  (MYCOLOG II) cream Apply 1 Application topically 2 (two) times daily. Use as needed   Omega-3 Fatty Acids (FISH OIL) 500 MG CAPS  Take by mouth.   rosuvastatin  (CRESTOR ) 20 MG tablet Take 0.5-1 tablets (10-20 mg total) by mouth daily.   No facility-administered encounter medications on file as of 10/11/2024.    Social History: Social History   Tobacco Use   Smoking status: Former    Types: Cigarettes   Smokeless tobacco: Never  Vaping Use   Vaping status: Never Used  Substance Use Topics   Alcohol use: Yes    Comment: occasional   Drug use: No    Family Medical History: Family History  Problem Relation Age of Onset   Stroke Paternal Grandmother     Hypertension Father    Heart disease Father    Cancer Mother        lymphoma   COPD Mother    Diabetes Neg Hx    Colon cancer Neg Hx    Colon polyps Neg Hx    Esophageal cancer Neg Hx    Rectal cancer Neg Hx    Stomach cancer Neg Hx     Physical Examination: There were no vitals filed for this visit.     Awake, alert, oriented to person, place, and time.  Speech is clear and fluent. Fund of knowledge is appropriate.   Cranial Nerves: Pupils equal round and reactive to light.  Facial tone is symmetric.    No abnormal lesions on exposed skin.   Strength: Side Biceps Triceps Deltoid Interossei Grip Wrist Ext. Wrist Flex.  R 5 5 5 5 5 5 5   L 5 5 5 5 5 5 5    Side Iliopsoas Quads Hamstring PF DF EHL  R 5 5 5 5 5 5   L 5 5 5 5 5 5    Reflexes are 2+ and symmetric at the biceps, brachioradialis, patella and achilles.   Hoffman's is absent.  Clonus is not present.   Bilateral upper and lower extremity sensation is intact to light touch.     Good ROM of both shoulders with no pain.   No pain with IR/ER of both hips.   She has abnormal gait- she limps.   Medical Decision Making  Imaging: None  Assessment and Plan: [name] has intermittent stiffness in her neck with limited ROM. She has intermittent right shoulder pain into her arm  to her elbow with numbness. No weakness. No pain in left arm.   She has known cervical spondylosis with mild DDD.   Also with chronic intermittent chronic LBP that is worse with walking. She has intermittent left lateral leg pain to her knee when pain is worse. Some improvement when she stops walking to sit.   She has known moderate lumbar spondylosis and DDD with significant TL scoliosis. She has multilevel bilateral foraminal stenosis.   LBP likely from underlying spondylosis. Leg pain may be from foraminal stenosis L5-S1.   Treatment options discussed with patient and following plan made:   - Recommend PT for cervical and lumbar spine.  She declines.  - She wants to hold on further treatment for neck right now. Consider cervical MRI if pain gets worse.  - History of renal cell carcinoma s/p partial nephrectomy- would avoid NSAIDs.  - Referral to pain management (Lateef) to consider lumbar injections.  - Follow up with me in 8 weeks and prn.   I spent a total of 20 minutes in face-to-face and non-face-to-face activities related to this patient's care today including review of outside records, review of imaging, review of symptoms, physical exam, discussion of differential diagnosis, discussion of  treatment options, and documentation.   Glade Boys PA-C Dept. of Neurosurgery

## 2024-10-11 ENCOUNTER — Ambulatory Visit: Admitting: Orthopedic Surgery

## 2024-10-19 ENCOUNTER — Encounter: Payer: Self-pay | Admitting: Student in an Organized Health Care Education/Training Program

## 2024-10-19 ENCOUNTER — Ambulatory Visit
Attending: Student in an Organized Health Care Education/Training Program | Admitting: Student in an Organized Health Care Education/Training Program

## 2024-10-19 VITALS — BP 132/65 | HR 66 | Temp 97.3°F | Ht 64.0 in | Wt 212.0 lb

## 2024-10-19 DIAGNOSIS — G894 Chronic pain syndrome: Secondary | ICD-10-CM | POA: Diagnosis present

## 2024-10-19 DIAGNOSIS — M5416 Radiculopathy, lumbar region: Secondary | ICD-10-CM | POA: Insufficient documentation

## 2024-10-19 DIAGNOSIS — M47816 Spondylosis without myelopathy or radiculopathy, lumbar region: Secondary | ICD-10-CM | POA: Insufficient documentation

## 2024-10-19 DIAGNOSIS — G8929 Other chronic pain: Secondary | ICD-10-CM | POA: Diagnosis present

## 2024-10-19 NOTE — Patient Instructions (Signed)
GENERAL RISKS AND COMPLICATIONS ° °What are the risk, side effects and possible complications? °Generally speaking, most procedures are safe.  However, with any procedure there are risks, side effects, and the possibility of complications.  The risks and complications are dependent upon the sites that are lesioned, or the type of nerve block to be performed.  The closer the procedure is to the spine, the more serious the risks are.  Great care is taken when placing the radio frequency needles, block needles or lesioning probes, but sometimes complications can occur. °Infection: Any time there is an injection through the skin, there is a risk of infection.  This is why sterile conditions are used for these blocks.  There are four possible types of infection. °Localized skin infection. °Central Nervous System Infection-This can be in the form of Meningitis, which can be deadly. °Epidural Infections-This can be in the form of an epidural abscess, which can cause pressure inside of the spine, causing compression of the spinal cord with subsequent paralysis. This would require an emergency surgery to decompress, and there are no guarantees that the patient would recover from the paralysis. °Discitis-This is an infection of the intervertebral discs.  It occurs in about 1% of discography procedures.  It is difficult to treat and it may lead to surgery. ° °      2. Pain: the needles have to go through skin and soft tissues, will cause soreness. °      3. Damage to internal structures:  The nerves to be lesioned may be near blood vessels or   ° other nerves which can be potentially damaged. °      4. Bleeding: Bleeding is more common if the patient is taking blood thinners such as  aspirin, Coumadin, Ticiid, Plavix, etc., or if he/she have some genetic predisposition  such as hemophilia. Bleeding into the spinal canal can cause compression of the spinal  cord with subsequent paralysis.  This would require an emergency  surgery to  decompress and there are no guarantees that the patient would recover from the  paralysis. °      5. Pneumothorax:  Puncturing of a lung is a possibility, every time a needle is introduced in  the area of the chest or upper back.  Pneumothorax refers to free air around the  collapsed lung(s), inside of the thoracic cavity (chest cavity).  Another two possible  complications related to a similar event would include: Hemothorax and Chylothorax.   These are variations of the Pneumothorax, where instead of air around the collapsed  lung(s), you may have blood or chyle, respectively. °      6. Spinal headaches: They may occur with any procedures in the area of the spine. °      7. Persistent CSF (Cerebro-Spinal Fluid) leakage: This is a rare problem, but may occur  with prolonged intrathecal or epidural catheters either due to the formation of a fistulous  track or a dural tear. °      8. Nerve damage: By working so close to the spinal cord, there is always a possibility of  nerve damage, which could be as serious as a permanent spinal cord injury with  paralysis. °      9. Death:  Although rare, severe deadly allergic reactions known as "Anaphylactic  reaction" can occur to any of the medications used. °     10. Worsening of the symptoms:  We can always make thing worse. ° °What are the chances   of something like this happening? °Chances of any of this occuring are extremely low.  By statistics, you have more of a chance of getting killed in a motor vehicle accident: while driving to the hospital than any of the above occurring .  Nevertheless, you should be aware that they are possibilities.  In general, it is similar to taking a shower.  Everybody knows that you can slip, hit your head and get killed.  Does that mean that you should not shower again?  Nevertheless always keep in mind that statistics do not mean anything if you happen to be on the wrong side of them.  Even if a procedure has a 1 (one) in a  1,000,000 (million) chance of going wrong, it you happen to be that one..Also, keep in mind that by statistics, you have more of a chance of having something go wrong when taking medications. ° °Who should not have this procedure? °If you are on a blood thinning medication (e.g. Coumadin, Plavix, see list of "Blood Thinners"), or if you have an active infection going on, you should not have the procedure.  If you are taking any blood thinners, please inform your physician. ° °How should I prepare for this procedure? °Do not eat or drink anything at least six hours prior to the procedure. °Bring a driver with you .  It cannot be a taxi. °Come accompanied by an adult that can drive you back, and that is strong enough to help you if your legs get weak or numb from the local anesthetic. °Take all of your medicines the morning of the procedure with just enough water to swallow them. °If you have diabetes, make sure that you are scheduled to have your procedure done first thing in the morning, whenever possible. °If you have diabetes, take only half of your insulin dose and notify our nurse that you have done so as soon as you arrive at the clinic. °If you are diabetic, but only take blood sugar pills (oral hypoglycemic), then do not take them on the morning of your procedure.  You may take them after you have had the procedure. °Do not take aspirin or any aspirin-containing medications, at least eleven (11) days prior to the procedure.  They may prolong bleeding. °Wear loose fitting clothing that may be easy to take off and that you would not mind if it got stained with Betadine or blood. °Do not wear any jewelry or perfume °Remove any nail coloring.  It will interfere with some of our monitoring equipment. ° °NOTE: Remember that this is not meant to be interpreted as a complete list of all possible complications.  Unforeseen problems may occur. ° °BLOOD THINNERS °The following drugs contain aspirin or other products,  which can cause increased bleeding during surgery and should not be taken for 2 weeks prior to and 1 week after surgery.  If you should need take something for relief of minor pain, you may take acetaminophen which is found in Tylenol,m Datril, Anacin-3 and Panadol. It is not blood thinner. The products listed below are.  Do not take any of the products listed below in addition to any listed on your instruction sheet. ° °A.P.C or A.P.C with Codeine Codeine Phosphate Capsules #3 Ibuprofen Ridaura  °ABC compound Congesprin Imuran rimadil  °Advil Cope Indocin Robaxisal  °Alka-Seltzer Effervescent Pain Reliever and Antacid Coricidin or Coricidin-D ° Indomethacin Rufen  °Alka-Seltzer plus Cold Medicine Cosprin Ketoprofen S-A-C Tablets  °Anacin Analgesic Tablets or Capsules Coumadin   Korlgesic Salflex  Anacin Extra Strength Analgesic tablets or capsules CP-2 Tablets Lanoril Salicylate  Anaprox Cuprimine Capsules Levenox Salocol  Anexsia-D Dalteparin Magan Salsalate  Anodynos Darvon compound Magnesium Salicylate Sine-off  Ansaid Dasin Capsules Magsal Sodium Salicylate  Anturane Depen Capsules Marnal Soma  APF Arthritis pain formula Dewitt's Pills Measurin Stanback  Argesic Dia-Gesic Meclofenamic Sulfinpyrazone  Arthritis Bayer Timed Release Aspirin Diclofenac Meclomen Sulindac  Arthritis pain formula Anacin Dicumarol Medipren Supac  Analgesic (Safety coated) Arthralgen Diffunasal Mefanamic Suprofen  Arthritis Strength Bufferin Dihydrocodeine Mepro Compound Suprol  Arthropan liquid Dopirydamole Methcarbomol with Aspirin Synalgos  ASA tablets/Enseals Disalcid Micrainin Tagament  Ascriptin Doan's Midol Talwin  Ascriptin A/D Dolene Mobidin Tanderil  Ascriptin Extra Strength Dolobid Moblgesic Ticlid  Ascriptin with Codeine Doloprin or Doloprin with Codeine Momentum Tolectin  Asperbuf Duoprin Mono-gesic Trendar  Aspergum Duradyne Motrin or Motrin IB Triminicin  Aspirin plain, buffered or enteric coated  Durasal Myochrisine Trigesic  Aspirin Suppositories Easprin Nalfon Trillsate  Aspirin with Codeine Ecotrin Regular or Extra Strength Naprosyn Uracel  Atromid-S Efficin Naproxen Ursinus  Auranofin Capsules Elmiron Neocylate Vanquish  Axotal Emagrin Norgesic Verin  Azathioprine Empirin or Empirin with Codeine Normiflo Vitamin E  Azolid Emprazil Nuprin Voltaren  Bayer Aspirin plain, buffered or children's or timed BC Tablets or powders Encaprin Orgaran Warfarin Sodium  Buff-a-Comp Enoxaparin Orudis Zorpin  Buff-a-Comp with Codeine Equegesic Os-Cal-Gesic   Buffaprin Excedrin plain, buffered or Extra Strength Oxalid   Bufferin Arthritis Strength Feldene Oxphenbutazone   Bufferin plain or Extra Strength Feldene Capsules Oxycodone with Aspirin   Bufferin with Codeine Fenoprofen Fenoprofen Pabalate or Pabalate-SF   Buffets II Flogesic Panagesic   Buffinol plain or Extra Strength Florinal or Florinal with Codeine Panwarfarin   Buf-Tabs Flurbiprofen Penicillamine   Butalbital Compound Four-way cold tablets Penicillin   Butazolidin Fragmin Pepto-Bismol   Carbenicillin Geminisyn Percodan   Carna Arthritis Reliever Geopen Persantine   Carprofen Gold's salt Persistin   Chloramphenicol Goody's Phenylbutazone   Chloromycetin Haltrain Piroxlcam   Clmetidine heparin Plaquenil   Cllnoril Hyco-pap Ponstel   Clofibrate Hydroxy chloroquine Propoxyphen         Before stopping any of these medications, be sure to consult the physician who ordered them.  Some, such as Coumadin (Warfarin) are ordered to prevent or treat serious conditions such as "deep thrombosis", "pumonary embolisms", and other heart problems.  The amount of time that you may need off of the medication may also vary with the medication and the reason for which you were taking it.  If you are taking any of these medications, please make sure you notify your pain physician before you undergo any procedures.         Facet Blocks Patient  Information  Description: The facets are joints in the spine between the vertebrae.  Like any joints in the body, facets can become irritated and painful.  Arthritis can also effect the facets.  By injecting steroids and local anesthetic in and around these joints, we can temporarily block the nerve supply to them.  Steroids act directly on irritated nerves and tissues to reduce selling and inflammation which often leads to decreased pain.  Facet blocks may be done anywhere along the spine from the neck to the low back depending upon the location of your pain.   After numbing the skin with local anesthetic (like Novocaine), a small needle is passed onto the facet joints under x-ray guidance.  You may experience a sensation of pressure while this is being done.  The   entire block usually lasts about 15-25 minutes.   Conditions which may be treated by facet blocks:  Low back/buttock pain Neck/shoulder pain Certain types of headaches  Preparation for the injection:  Do not eat any solid food or dairy products within 8 hours of your appointment. You may drink clear liquid up to 3 hours before appointment.  Clear liquids include water, black coffee, juice or soda.  No milk or cream please. You may take your regular medication, including pain medications, with a sip of water before your appointment.  Diabetics should hold regular insulin (if taken separately) and take 1/2 normal NPH dose the morning of the procedure.  Carry some sugar containing items with you to your appointment. A driver must accompany you and be prepared to drive you home after your procedure. Bring all your current medications with you. An IV may be inserted and sedation may be given at the discretion of the physician. A blood pressure cuff, EKG and other monitors will often be applied during the procedure.  Some patients may need to have extra oxygen administered for a short period. You will be asked to provide medical information,  including your allergies and medications, prior to the procedure.  We must know immediately if you are taking blood thinners (like Coumadin/Warfarin) or if you are allergic to IV iodine contrast (dye).  We must know if you could possible be pregnant.  Possible side-effects:  Bleeding from needle site Infection (rare, may require surgery) Nerve injury (rare) Numbness & tingling (temporary) Difficulty urinating (rare, temporary) Spinal headache (a headache worse with upright posture) Light-headedness (temporary) Pain at injection site (serveral days) Decreased blood pressure (rare, temporary) Weakness in arm/leg (temporary) Pressure sensation in back/neck (temporary)   Call if you experience:  Fever/chills associated with headache or increased back/neck pain Headache worsened by an upright position New onset, weakness or numbness of an extremity below the injection site Hives or difficulty breathing (go to the emergency room) Inflammation or drainage at the injection site(s) Severe back/neck pain greater than usual New symptoms which are concerning to you  Please note:  Although the local anesthetic injected can often make your back or neck feel good for several hours after the injection, the pain will likely return. It takes 3-7 days for steroids to work.  You may not notice any pain relief for at least one week.  If effective, we will often do a series of 2-3 injections spaced 3-6 weeks apart to maximally decrease your pain.  After the initial series, you may be a candidate for a more permanent nerve block of the facets.  If you have any questions, please call [phone] [facility] 

## 2024-10-19 NOTE — Progress Notes (Signed)
 Safety precautions to be maintained throughout the outpatient stay will include: orient to surroundings, keep bed in low position, maintain call bell within reach at all times, provide assistance with transfer out of bed and ambulation.

## 2024-10-19 NOTE — Progress Notes (Signed)
 PROVIDER NOTE: Interpretation of information contained herein should be left to medically-trained personnel. Specific patient instructions are provided elsewhere under Patient Instructions section of medical record. This document was created in part using AI and STT-dictation technology, any transcriptional errors that may result from this process are unintentional.  Patient: Kristen Ortega  Service: E/M   PCP: Watt Harlene BROCKS, MD  DOB: [birth year]  DOS: 10/19/2024  Provider: Wallie Sherry, MD  MRN: [id number]  Delivery: Face-to-face  Specialty: Interventional Pain Management  Type: Established Patient  Setting: Ambulatory outpatient facility  Specialty designation: 09  Referring Prov.: Copland, Harlene BROCKS, MD  Location: Outpatient office facility       History of present illness (HPI) [name], a 58 y.o. year old female, is here today because of her Lumbar facet arthropathy [M47.816]. [name] primary complain today is Back Pain (lower)  Pain Assessment: Severity of Chronic pain is reported as a 4 /10. Location: Back Lower/Denies. Onset: More than a month ago. Quality: Aching, Constant, Discomfort. Timing: Constant. Modifying factor(s): Meds and rest. Vitals:  height is 5' 4 (1.626 m) and weight is 212 lb (96.2 kg). Her temperature is 97.3 F (36.3 C) (abnormal). Her blood pressure is 132/65 and her pulse is 66. Her oxygen saturation is 96%.  BMI: Estimated body mass index is 36.39 kg/m as calculated from the following:   Height as of this encounter: 5' 4 (1.626 m).   Weight as of this encounter: 212 lb (96.2 kg).  Last encounter: 09/21/2024. Last procedure: 09/27/2024.  Reason for encounter: post-procedure evaluation and assessment.   Post-Procedure Evaluation   Type: Lumbar Facet, Medial Branch Block(s) (w/ fluoroscopic mapping) #1  Laterality: Bilateral  Level: L3, L4, and L5 Medial Branch/Dorsal Rami Level(s). Injecting these levels blocks the L3-4 and L4-5 lumbar  facet joints. Imaging: Fluoroscopic guidance Spinal ([id number]) Anesthesia: Local anesthesia (1-2% Lidocaine) DOS: 09/27/2024 Performed by: Wallie Sherry, MD  Primary Purpose: Diagnostic/Therapeutic Indications: Low back pain severe enough to impact quality of life or function. 1. Lumbar facet arthropathy   2. Lumbar spondylosis    NAS-11 Pain score:   Pre-procedure: 5 /10   Post-procedure: 5 /10     Effectiveness:  Initial hour after procedure: 100 %  Subsequent 4-6 hours post-procedure: 100 %  Analgesia past initial 6 hours: 100 % (lasted for 1 week)  Ongoing improvement:  Analgesic:  100% for 1 week then back to baseline Function: Somewhat improved ROM: Back to baseline   ROS  Constitutional: Denies any fever or chills Gastrointestinal: No reported hemesis, hematochezia, vomiting, or acute GI distress Musculoskeletal: + low back pain Neurological: No reported episodes of acute onset apraxia, aphasia, dysarthria, agnosia, amnesia, paralysis, loss of coordination, or loss of consciousness  Medication Review  Fish Oil, albuterol , calcium  carbonate, diphenhydrAMINE, escitalopram , famotidine , nystatin -triamcinolone , and rosuvastatin   History Review  Allergy: [name] has no known allergies. Drug: [name]  reports no history of drug use. Alcohol:  reports current alcohol use. Tobacco:  reports that she has quit smoking. Her smoking use included cigarettes. She has never used smokeless tobacco. Social: [name]  reports that she has quit smoking. Her smoking use included cigarettes. She has never used smokeless tobacco. She reports current alcohol use. She reports that she does not use drugs. Medical:  has a past medical history of Anxiety, Arthritis (09/17/2017), Cancer (HCC) (12/2017), Depression, Hyperlipidemia, Hypertension, and Vaginal Pap smear, abnormal. Surgical: [name]  has a past surgical history that includes Colposcopy; Cesarean section; Kidney surgery;  Wisdom tooth extraction; and Colonoscopy (02/14/2020). Family: family history includes COPD in her mother; Cancer in her mother; Heart disease in her father; Hypertension in her father; Stroke in her paternal grandmother.  Laboratory Chemistry Profile   Renal Lab Results  Component Value Date   BUN 17 04/13/2024   CREATININE 0.97 04/13/2024   BCR 18 04/13/2024   GFR 77.23 11/21/2021    Hepatic Lab Results  Component Value Date   AST 22 04/13/2024   ALT 15 04/13/2024   ALBUMIN 4.3 04/13/2024   ALKPHOS 88 04/13/2024    Electrolytes Lab Results  Component Value Date   NA 144 04/13/2024   K 3.9 04/13/2024   CL 104 04/13/2024   CALCIUM  9.1 04/13/2024    Bone Lab Results  Component Value Date   VD25OH 17.75 (L) 04/03/2023    Inflammation (CRP: Acute Phase) (ESR: Chronic Phase) No results found for: CRP, ESRSEDRATE, LATICACIDVEN       Note: Above Lab results reviewed.  Recent Imaging Review  MR LUMBAR SPINE WO CONTRAST   Narrative EXAM: MRI LUMBAR SPINE 07/19/2024 08:51:37 AM   TECHNIQUE: Multiplanar multisequence MRI of the lumbar spine was performed without the administration of intravenous contrast.   COMPARISON: Lumbar spine radiographs 04/28/2023.   CLINICAL HISTORY: Evaluate chronic LBP and leg pain. LBP RAD BILAT LE; X 2016, NKI, NO SX; HX RENAL CA 2018; NO INJECTIONS.   FINDINGS:   BONES AND ALIGNMENT: Levoconvex curvature of the lumbar spine centered at L5. Type 2 and type 3 Modic changes are present on the right at L2-3 and L3-4. Type 2 Modic changes are present on the right at L1-2.   SPINAL CORD: Conus medullaris terminates at L1-2.   SOFT TISSUES: Scarring and parenchymal thinning is present at the lower pole of the left kidney.   L1-L2: Slight retrolisthesis present at L1-2. Uncovertebral broad-based disc protrusion is present. Asymmetric right-sided facet hypertrophy is present. Moderate right and mild left foraminal narrowing is  present.   L2-L3: A broad-based disc protrusion is present. Asymmetric right-sided facet hypertrophy is present. Moderate right foraminal narrowing is present.   L3-L4: A broad-based disc protrusion is present. Advanced facet hypertrophy is present on the right. Mild foraminal narrowing is worse on the right.   L4-L5: A broad-based disc protrusion is present. Asymmetric left-sided facet hypertrophy is present. Mild left subarticular and moderate left foraminal narrowing is present.   L5-S1: A broad-based disc protrusion is present. Moderate facet hypertrophy is worse on the left. Moderate left and mild right foraminal stenosis is present.   IMPRESSION: 1. Multilevel degenerative changes with broad-based disc protrusions and facet hypertrophy resulting in foraminal narrowing as described above.   Electronically signed by: Lonni Necessary MD 07/25/2024 12:50 PM EDT RP Workstation: [id number] Note: Reviewed        Physical Exam  Vitals: BP 132/65   Pulse 66   Temp (!) 97.3 F (36.3 C)   Ht 5' 4 (1.626 m)   Wt 212 lb (96.2 kg)   SpO2 96%   BMI 36.39 kg/m  BMI: Estimated body mass index is 36.39 kg/m as calculated from the following:   Height as of this encounter: 5' 4 (1.626 m).   Weight as of this encounter: 212 lb (96.2 kg). Ideal: Ideal body weight: 54.7 kg (120 lb 9.5 oz) Adjusted ideal body weight: 71.3 kg (157 lb 2.5 oz) General appearance: Well nourished, well developed, and well hydrated. In no apparent acute distress Mental status: Alert, oriented x 3 (person,  place, & time)       Respiratory: No evidence of acute respiratory distress Eyes: PERLA  Lumbar Spine Area Exam  Skin & Axial Inspection: No masses, redness, or swelling Alignment: Symmetrical Functional ROM: Pain restricted ROM affecting both sides Stability: No instability detected Muscle Tone/Strength: Functionally intact. No obvious neuro-muscular anomalies detected. Sensory (Neurological):  Musculoskeletal pain pattern Palpation: No palpable anomalies       Provocative Tests: Hyperextension/rotation test: (+) bilaterally for facet joint pain. Lumbar quadrant test (Kemp's test): (+) bilaterally for facet joint pain.     Gait & Posture Assessment  Ambulation: Unassisted Gait: Relatively normal for age and body habitus Posture: WNL  Lower Extremity Exam      Side: Right lower extremity   Side: Left lower extremity  Stability: No instability observed           Stability: No instability observed          Skin & Extremity Inspection: Skin color, temperature, and hair growth are WNL. No peripheral edema or cyanosis. No masses, redness, swelling, asymmetry, or associated skin lesions. No contractures.   Skin & Extremity Inspection: Skin color, temperature, and hair growth are WNL. No peripheral edema or cyanosis. No masses, redness, swelling, asymmetry, or associated skin lesions. No contractures.  Functional ROM: Unrestricted ROM                   Functional ROM: Unrestricted ROM                  Muscle Tone/Strength: Functionally intact. No obvious neuro-muscular anomalies detected.   Muscle Tone/Strength: Functionally intact. No obvious neuro-muscular anomalies detected.  Sensory (Neurological): Unimpaired         Sensory (Neurological): Unimpaired        DTR: Patellar: deferred today Achilles: deferred today Plantar: deferred today   DTR: Patellar: deferred today Achilles: deferred today Plantar: deferred today  Palpation: No palpable anomalies   Palpation: No palpable anomalies     Assessment   Diagnosis  1. Lumbar facet arthropathy   2. Lumbar spondylosis   3. Chronic radicular lumbar pain   4. Chronic pain syndrome      Updated Problems: No problems updated.  Plan of Care  The patient is status post positive diagnostic lumbar medial branch nerve block (Block #1) at bilateral L3, L4, and L5 levels, with significant temporary pain relief, consistent with  facet-mediated low back pain.  We will proceed with a second diagnostic medial branch block at the same levels to confirm the diagnosis, as per standard protocol. If the patient experiences similar positive response with Block #2, we will then consider proceeding with lumbar medial branch radiofrequency ablation (RFA) for longer-term pain relief.  The patient was counseled on the rationale, goals, and expected outcomes of repeat diagnostic blocks and RFA, and agrees with the plan. Follow-up will be scheduled accordingly.   Orders:  Orders Placed This Encounter  Procedures   LUMBAR FACET(MEDIAL BRANCH NERVE BLOCK) MBNB    Diagnosis: Lumbar Facet Syndrome (M47.816); Lumbosacral Facet Syndrome (M47.817); Lumbar Facet Joint Pain (M54.59) Medical Necessity Statement: 1.Severe chronic axial low back pain causing functional impairment documented by ongoing pain scale assessments. 2.Pain present for longer than 3 months (Chronic) documented to have failed noninvasive conservative therapies. 3.Absence of untreated radiculopathy. 4.There is no radiological evidence of untreated fractures, tumor, infection, or deformity.  Physical Examination Findings: Positive Kemp Maneuver: (Y)  Positive Lumbar Hyperextension-Rotation provocative test: (Y)    Standing Status:  Future    Expiration Date:   01/19/2025    Scheduling Instructions:     Procedure: Lumbar facet Block     Type: Medial Branch Block     Side: Bilateral     Purpose: Diagnostic Radiologic Mapping     Level(s): L3-4, L4-5, by Fluoroscopic Mapping Facets (L3, L4, L5,Medial Branch)     Sedation: without     Timeframe: As soon as schedule allows.    Where will this procedure be performed?:   [facility]     B/L L3,4,5 MBNB 09/27/24   Return in about 13 days (around 11/01/2024) for B/L L3, 4, 5 MBNB #2, in clinic [facility].    Recent Visits Date Type Provider Dept  09/27/24 Procedure visit Marcelino Nurse, MD [facility]   09/21/24 Office Visit Marcelino Nurse, MD [facility]  Showing recent visits within past 90 days and meeting all other requirements Today's Visits Date Type Provider Dept  10/19/24 Office Visit Marcelino Nurse, MD [facility]  Showing today's visits and meeting all other requirements Future Appointments No visits were found meeting these conditions. Showing future appointments within next 90 days and meeting all other requirements  I discussed the assessment and treatment plan with the patient. The patient was provided an opportunity to ask questions and all were answered. The patient agreed with the plan and demonstrated an understanding of the instructions.  Patient advised to call back or seek an in-person evaluation if the symptoms or condition worsens.  I personally spent a total of 20 minutes in the care of the patient today including preparing to see the patient, getting/reviewing separately obtained history, performing a medically appropriate exam/evaluation, counseling and educating, placing orders, and documenting clinical information in the EHR.   Note by: Nurse Marcelino, MD (TTS and AI technology used. I apologize for any typographical errors that were not detected and corrected.) Date: 10/19/2024; Time: 3:07 PM

## 2024-11-01 ENCOUNTER — Ambulatory Visit
Admission: RE | Admit: 2024-11-01 | Discharge: 2024-11-01 | Disposition: A | Discharge status: Home / Self Care | Source: Ambulatory Visit | Attending: Student in an Organized Health Care Education/Training Program | Admitting: Student in an Organized Health Care Education/Training Program

## 2024-11-01 ENCOUNTER — Encounter: Payer: Self-pay | Admitting: Student in an Organized Health Care Education/Training Program

## 2024-11-01 ENCOUNTER — Ambulatory Visit (HOSPITAL_BASED_OUTPATIENT_CLINIC_OR_DEPARTMENT_OTHER): Admitting: Student in an Organized Health Care Education/Training Program

## 2024-11-01 DIAGNOSIS — M47816 Spondylosis without myelopathy or radiculopathy, lumbar region: Secondary | ICD-10-CM | POA: Diagnosis present

## 2024-11-01 MED ORDER — ROPIVACAINE HCL 2 MG/ML IJ SOLN
INTRAMUSCULAR | Status: AC
Start: 2024-11-01 — End: 2024-11-01
  Filled 2024-11-01: qty 20

## 2024-11-01 MED ORDER — LIDOCAINE HCL 2 % IJ SOLN
20.0000 mL | Freq: Once | INTRAMUSCULAR | Status: AC
  Administered 2024-11-01: 400 mg

## 2024-11-01 MED ORDER — LACTATED RINGERS IV SOLN: Freq: Once | INTRAVENOUS | Status: DC

## 2024-11-01 MED ORDER — MIDAZOLAM HCL (PF) 2 MG/2ML IJ SOLN: 0.5000 mg | Freq: Once | INTRAMUSCULAR | Status: DC

## 2024-11-01 MED ORDER — ROPIVACAINE HCL 2 MG/ML IJ SOLN
18.0000 mL | Freq: Once | INTRAMUSCULAR | Status: AC
  Administered 2024-11-01: 18 mL via PERINEURAL

## 2024-11-01 MED ORDER — DEXAMETHASONE SOD PHOSPHATE PF 10 MG/ML IJ SOLN
20.0000 mg | Freq: Once | INTRAMUSCULAR | Status: AC
  Administered 2024-11-01: 20 mg

## 2024-11-01 MED ORDER — LIDOCAINE HCL 2 % IJ SOLN
INTRAMUSCULAR | Status: AC
  Filled 2024-11-01: qty 20

## 2024-11-01 NOTE — Progress Notes (Signed)
 PROVIDER NOTE: Interpretation of information contained herein should be left to medically-trained personnel. Specific patient instructions are provided elsewhere under Patient Instructions section of medical record. This document was created in part using STT-dictation technology, any transcriptional errors that may result from this process are unintentional.  Patient: Kristen Ortega Type: Established DOB: [birth year] MRN: [id number] PCP: Watt Harlene BROCKS, MD  Service: Procedure DOS: 11/01/2024 Setting: Ambulatory Location: Ambulatory outpatient facility Delivery: Face-to-face Provider: Wallie Sherry, MD Specialty: Interventional Pain Management Specialty designation: 09 Location: Outpatient facility Ref. Prov.: Sherry Wallie, MD       Interventional Therapy   Type: Lumbar Facet, Medial Branch Block(s) (w/ fluoroscopic mapping) #2  Laterality: Bilateral  Level: L3, L4, and L5 Medial Branch/Dorsal Rami Level(s). Injecting these levels blocks the L3-4 and L4-5 lumbar facet joints. Imaging: Fluoroscopic guidance Spinal ([id number]) Anesthesia: Local anesthesia (1-2% Lidocaine) DOS: 11/01/2024 Performed by: Wallie Sherry, MD  Primary Purpose: Diagnostic/Therapeutic Indications: Low back pain severe enough to impact quality of life or function. 1. Lumbar facet arthropathy   2. Lumbar spondylosis    NAS-11 Pain score:   Pre-procedure: 7 /10   Post-procedure: 0-No pain/10     Position / Prep / Materials:  Position: Prone  Prep solution: ChloraPrep (2% chlorhexidine gluconate and 70% isopropyl alcohol) Area Prepped: Posterolateral Lumbosacral Spine (Wide prep: From the lower border of the scapula down to the end of the tailbone and from flank to flank.)  Materials:  Tray: Block Needle(s):  Type: Spinal  Gauge (G): 22  Length: 5-in Qty: 3     H&P (Pre-op Assessment):  Ms. Shorey is a 58 y.o. (year old), female patient, seen today for interventional treatment. She  has a past  surgical history that includes Colposcopy; Cesarean section; Kidney surgery; Wisdom tooth extraction; and Colonoscopy (02/14/2020). Ms. Veale has a current medication list which includes the following prescription(s): albuterol , calcium  carbonate, diphenhydramine, escitalopram , famotidine , nystatin -triamcinolone , fish oil, and rosuvastatin , and the following Facility-Administered Medications: lactated ringers and midazolam pf. Her primarily concern today is the Back Pain (lower)  Initial Vital Signs:  Pulse/HCG Rate: (!) 58ECG Heart Rate: 60 Temp: 97.7 F (36.5 C) Resp: 17 BP: (!) 148/73 SpO2: 97 %  BMI: Estimated body mass index is 36.39 kg/m as calculated from the following:   Height as of this encounter: 5' 4 (1.626 m).   Weight as of this encounter: 212 lb (96.2 kg).  Risk Assessment: Allergies: Reviewed. She has no known allergies.  Allergy Precautions: None required Coagulopathies: Reviewed. None identified.  Blood-thinner therapy: None at this time Active Infection(s): Reviewed. None identified. Ms. Barbato is afebrile  Site Confirmation: Ms. Charlson was asked to confirm the procedure and laterality before marking the site Procedure checklist: Completed Consent: Before the procedure and under the influence of no sedative(s), amnesic(s), or anxiolytics, the patient was informed of the treatment options, risks and possible complications. To fulfill our ethical and legal obligations, as recommended by the American Medical Association's Code of Ethics, I have informed the patient of my clinical impression; the nature and purpose of the treatment or procedure; the risks, benefits, and possible complications of the intervention; the alternatives, including doing nothing; the risk(s) and benefit(s) of the alternative treatment(s) or procedure(s); and the risk(s) and benefit(s) of doing nothing. The patient was provided information about the general risks and possible complications  associated with the procedure. These may include, but are not limited to: failure to achieve desired goals, infection, bleeding, organ or nerve damage, allergic reactions, paralysis, and death. In  addition, the patient was informed of those risks and complications associated to Spine-related procedures, such as failure to decrease pain; infection (i.e.: Meningitis, epidural or intraspinal abscess); bleeding (i.e.: epidural hematoma, subarachnoid hemorrhage, or any other type of intraspinal or peri-dural bleeding); organ or nerve damage (i.e.: Any type of peripheral nerve, nerve root, or spinal cord injury) with subsequent damage to sensory, motor, and/or autonomic systems, resulting in permanent pain, numbness, and/or weakness of one or several areas of the body; allergic reactions; (i.e.: anaphylactic reaction); and/or death. Furthermore, the patient was informed of those risks and complications associated with the medications. These include, but are not limited to: allergic reactions (i.e.: anaphylactic or anaphylactoid reaction(s)); adrenal axis suppression; blood sugar elevation that in diabetics may result in ketoacidosis or comma; water retention that in patients with history of congestive heart failure may result in shortness of breath, pulmonary edema, and decompensation with resultant heart failure; weight gain; swelling or edema; medication-induced neural toxicity; particulate matter embolism and blood vessel occlusion with resultant organ, and/or nervous system infarction; and/or aseptic necrosis of one or more joints. Finally, the patient was informed that Medicine is not an exact science; therefore, there is also the possibility of unforeseen or unpredictable risks and/or possible complications that may result in a catastrophic outcome. The patient indicated having understood very clearly. We have given the patient no guarantees and we have made no promises. Enough time was given to the patient to  ask questions, all of which were answered to the patient's satisfaction. Ms. Staten has indicated that she wanted to continue with the procedure. Attestation: I, the ordering provider, attest that I have discussed with the patient the benefits, risks, side-effects, alternatives, likelihood of achieving goals, and potential problems during recovery for the procedure that I have provided informed consent. Date  Time: 11/01/2024  1:04 PM  Pre-Procedure Preparation:  Monitoring: As per clinic protocol. Respiration, ETCO2, SpO2, BP, heart rate and rhythm monitor placed and checked for adequate function Safety Precautions: Patient was assessed for positional comfort and pressure points before starting the procedure. Time-out: I initiated and conducted the Time-out before starting the procedure, as per protocol. The patient was asked to participate by confirming the accuracy of the Time Out information. Verification of the correct person, site, and procedure were performed and confirmed by me, the nursing staff, and the patient. Time-out conducted as per Joint Commission's Universal Protocol (UP.01.01.01). Time: 1152 Start Time: 1153 hrs.  Description of Procedure:          Laterality: (see above) Targeted Levels: (see above)  Safety Precautions: Aspiration looking for blood return was conducted prior to all injections. At no point did we inject any substances, as a needle was being advanced. Before injecting, the patient was told to immediately notify me if she was experiencing any new onset of ringing in the ears, or metallic taste in the mouth. No attempts were made at seeking any paresthesias. Safe injection practices and needle disposal techniques used. Medications properly checked for expiration dates. SDV (single dose vial) medications used. After the completion of the procedure, all disposable equipment used was discarded in the proper designated medical waste containers. Local Anesthesia:  Protocol guidelines were followed. The patient was positioned over the fluoroscopy table. The area was prepped in the usual manner. The time-out was completed. The target area was identified using fluoroscopy. A 12-in long, straight, sterile hemostat was used with fluoroscopic guidance to locate the targets for each level blocked. Once located, the skin was marked  with an approved surgical skin marker. Once all sites were marked, the skin (epidermis, dermis, and hypodermis), as well as deeper tissues (fat, connective tissue and muscle) were infiltrated with a small amount of a short-acting local anesthetic, loaded on a 10cc syringe with a 25G, 1.5-in  Needle. An appropriate amount of time was allowed for local anesthetics to take effect before proceeding to the next step. Local Anesthetic: Lidocaine 2.0% The unused portion of the local anesthetic was discarded in the proper designated containers. Technical description of process:  Medial Branch  Dorsal Rami Nerve Block (MBB):  Neuroanatomy note: Each lumbar facet joint receives dual innervation from medial branches arising from the posterior primary rami at the same level and one level above. The target for each lumbar medial branch is the junction of the ipsilateral superior articular and transverse process of the lower vertebral body. (i.e.: The L4-L5 facet joint is innervated by the L4 medial branch [located at L5] and the L3 medial branch [located at L4]. Blocking the L4 Medial Branch is therefore achieved by injecting at the junction of the ipsilateral superior articular and transverse process of the lower vertebral body [L5].).  Exception: The exception to the above rule is the L5-S1 facet joint which has triple innervation requiring the L4 medial branch, as well as the L5 and the S1 Dorsal Rami(s) to be blocked to fully denervate the joint.  Under fluoroscopic guidance, a needle was inserted until contact was made with os over the target area. After  negative aspiration, 2mL of the nerve block solution was injected without difficulty or complication. Paresthesia were avoided during injection. The needle(s) were removed intact and without complication.  Once the entire procedure was completed, the treated area was cleaned, making sure to leave some of the prepping solution back to take advantage of its long term bactericidal properties.         Illustration of the posterior view of the lumbar spine and the posterior neural structures. Laminae of L2 through S1 are labeled. DPRL5, dorsal primary ramus of L5; DPRS1, dorsal primary ramus of S1; DPR3, dorsal primary ramus of L3; FJ, facet (zygapophyseal) joint L3-L4; I, inferior articular process of L4; LB1, lateral branch of dorsal primary ramus of L1; IAB, inferior articular branches from L3 medial branch (supplies L4-L5 facet joint); IBP, intermediate branch plexus; MB3, medial branch of dorsal primary ramus of L3; NR3, third lumbar nerve root; S, superior articular process of L5; SAB, superior articular branches from L4 (supplies L4-5 facet joint also); TP3, transverse process of L3.   Facet Joint Innervation (* possible contribution)  L1-2 T12, L1 (L2*)  Medial Branch  L2-3 L1, L2 (L3*)                     L3-4 L2, L3 (L4*)                     L4-5 L3, L4 (L5*)                     L5-S1 L4, L5, S1                        Vitals:   11/01/24 1311 11/01/24 1350 11/01/24 1355 11/01/24 1359  BP: (!) 148/73 (!) 155/82 (!) 153/70 (!) 174/85  Pulse: (!) 58     Resp:  17 18 (!) 23  Temp: 97.7 F (36.5 C)     SpO2: 97% 96%  95% 96%  Weight: 212 lb (96.2 kg)     Height: 5' 4 (1.626 m)        End Time: 1359 hrs.  Imaging Guidance (Spinal):         Type of Imaging Technique: Fluoroscopy Guidance (Spinal) Indication(s): Fluoroscopy guidance for needle placement to enhance accuracy in procedures requiring precise needle localization for targeted delivery of medication in or near  specific anatomical locations not easily accessible without such real-time imaging assistance. Exposure Time: Please see nurses notes. Contrast: None used. Fluoroscopic Guidance: I was personally present during the use of fluoroscopy. Tunnel Vision Technique used to obtain the best possible view of the target area. Parallax error corrected before commencing the procedure. Direction-depth-direction technique used to introduce the needle under continuous pulsed fluoroscopy. Once target was reached, antero-posterior, oblique, and lateral fluoroscopic projection used confirm needle placement in all planes. Images permanently stored in EMR. Interpretation: No contrast injected. I personally interpreted the imaging intraoperatively. Adequate needle placement confirmed in multiple planes. Permanent images saved into the patient's record.  Post-operative Assessment:  Post-procedure Vital Signs:  Pulse/HCG Rate: (!) 58(!) 59 Temp: 97.7 F (36.5 C) Resp: (!) 23 BP: (!) 174/85 SpO2: 96 %  EBL: None  Complications: No immediate post-treatment complications observed by team, or reported by patient.  Note: The patient tolerated the entire procedure well. A repeat set of vitals were taken after the procedure and the patient was kept under observation following institutional policy, for this type of procedure. Post-procedural neurological assessment was performed, showing return to baseline, prior to discharge. The patient was provided with post-procedure discharge instructions, including a section on how to identify potential problems. Should any problems arise concerning this procedure, the patient was given instructions to immediately contact us , at any time, without hesitation. In any case, we plan to contact the patient by telephone for a follow-up status report regarding this interventional procedure.  Comments:  No additional relevant information.  Plan of Care (POC)  Orders:  Orders Placed This  Encounter  Procedures   DG PAIN [facility] C-ARM 1-60 MIN NO REPORT    Intraoperative interpretation by procedural physician at [facility].    Standing Status:   Standing    Number of Occurrences:   1    Reason for exam::   Assistance in needle guidance and placement for procedures requiring needle placement in or near specific anatomical locations not easily accessible without such assistance.     Medications ordered for procedure: Meds ordered this encounter  Medications   lidocaine (XYLOCAINE) 2 % (with pres) injection 400 mg   lactated ringers infusion   midazolam PF (VERSED) injection 0.5-2 mg    Make sure Flumazenil is available in the pyxis when using this medication. If oversedation occurs, administer 0.2 mg IV over 15 sec. If after 45 sec no response, administer 0.2 mg again over 1 min; may repeat at 1 min intervals; not to exceed 4 doses (1 mg)   ropivacaine (PF) 2 mg/mL (0.2%) (NAROPIN) injection 18 mL   dexamethasone (DECADRON) injection 20 mg   Medications administered: We administered lidocaine, ropivacaine (PF) 2 mg/mL (0.2%), and dexamethasone.  See the medical record for exact dosing, route, and time of administration.    B/L L3,4,5 MBNB 09/27/24, 11/01/24    Follow-up plan:   Return in about 4 weeks (around 11/29/2024) for PPE with Seema .     Recent Visits Date Type Provider Dept  10/19/24 Office Visit Marcelino Nurse, MD [facility]  09/27/24 Procedure visit Marcelino Nurse, MD [facility]  09/21/24 Office Visit Marcelino Nurse, MD [facility]  Showing recent visits within past 90 days and meeting all other requirements Today's Visits Date Type Provider Dept  11/01/24 Procedure visit Marcelino Nurse, MD [facility]  Showing today's visits and meeting all other requirements Future Appointments No visits were found meeting these conditions. Showing future appointments within next 90 days and meeting all other  requirements   Disposition: Discharge home  Discharge (Date  Time): 11/01/2024; 1404 hrs.   Primary Care Physician: Watt Harlene BROCKS, MD Location: [facility] Note by: Nurse Marcelino, MD (TTS technology used. I apologize for any typographical errors that were not detected and corrected.) Date: 11/01/2024; Time: 2:06 PM  Disclaimer:  Medicine is not an visual merchandiser. The only guarantee in medicine is that nothing is guaranteed. It is important to note that the decision to proceed with this intervention was based on the information collected from the patient. The Data and conclusions were drawn from the patient's questionnaire, the interview, and the physical examination. Because the information was provided in large part by the patient, it cannot be guaranteed that it has not been purposely or unconsciously manipulated. Every effort has been made to obtain as much relevant data as possible for this evaluation. It is important to note that the conclusions that lead to this procedure are derived in large part from the available data. Always take into account that the treatment will also be dependent on availability of resources and existing treatment guidelines, considered by other Pain Management Practitioners as being common knowledge and practice, at the time of the intervention. For Medico-Legal purposes, it is also important to point out that variation in procedural techniques and pharmacological choices are the acceptable norm. The indications, contraindications, technique, and results of the above procedure should only be interpreted and judged by a Board-Certified Interventional Pain Specialist with extensive familiarity and expertise in the same exact procedure and technique.

## 2024-11-01 NOTE — Progress Notes (Signed)
 Safety precautions to be maintained throughout the outpatient stay will include: orient to surroundings, keep bed in low position, maintain call bell within reach at all times, provide assistance with transfer out of bed and ambulation.

## 2024-11-01 NOTE — Patient Instructions (Signed)

## 2024-11-02 ENCOUNTER — Telehealth: Payer: Self-pay

## 2024-11-02 NOTE — Telephone Encounter (Signed)
 Post procedure follow up.  Patient states she is doing good.

## 2024-11-29 ENCOUNTER — Encounter: Payer: Self-pay | Admitting: Nurse Practitioner

## 2024-11-29 ENCOUNTER — Ambulatory Visit: Admitting: Nurse Practitioner

## 2024-11-29 VITALS — BP 178/97 | HR 67 | Temp 97.8°F | Resp 18 | Ht 64.0 in | Wt 210.0 lb

## 2024-11-29 DIAGNOSIS — M47816 Spondylosis without myelopathy or radiculopathy, lumbar region: Secondary | ICD-10-CM | POA: Insufficient documentation

## 2024-11-29 DIAGNOSIS — G8929 Other chronic pain: Secondary | ICD-10-CM | POA: Diagnosis present

## 2024-11-29 DIAGNOSIS — G894 Chronic pain syndrome: Secondary | ICD-10-CM | POA: Insufficient documentation

## 2024-11-29 DIAGNOSIS — M5416 Radiculopathy, lumbar region: Secondary | ICD-10-CM | POA: Diagnosis present

## 2024-11-29 NOTE — Progress Notes (Signed)
 PROVIDER NOTE: Interpretation of information contained herein should be left to medically-trained personnel. Specific patient instructions are provided elsewhere under Patient Instructions section of medical record. This document was created in part using AI and STT-dictation technology, any transcriptional errors that may result from this process are unintentional.  Patient: Kristen Ortega  Service: E/M   PCP: Watt Harlene BROCKS, MD  DOB: 1966/03/28  DOS: 11/29/2024  Provider: Emmy MARLA Blanch, NP  MRN: [id number]  Delivery: Face-to-face  Specialty: Interventional Pain Management  Type: Established Patient  Setting: Ambulatory outpatient facility  Specialty designation: 09  Referring Prov.: Copland, Harlene BROCKS, MD  Location: Outpatient office facility       History of present illness (HPI) Kristen Ortega, a 58 y.o. year old female, is here today because of her Lumbar facet arthropathy [M47.816]. Ms. Mahajan primary complain today is Back Pain  Pertinent problems: Ms. Cutshaw has Benign essential HTN; Scoliosis of thoracolumbar spine; Renal cell carcinoma (HCC); Chronic back pain; Lumbar spondylosis; Chronic radicular lumbar pain; Chronic pain syndrome; and Lumbar facet arthropathy on their pertinent problem list.  Pain Assessment: Severity of Chronic pain is reported as a 5 /10. Location: Back Lower/radiates down left leg occassionaly. Onset: More than a month ago. Quality: Aching. Timing: Constant. Modifying factor(s): Sitting down. Vitals:  height is 5' 4 (1.626 m) and weight is 210 lb (95.3 kg). Her temporal temperature is 97.8 F (36.6 C). Her blood pressure is 178/97 (abnormal) and her pulse is 67. Her respiration is 18 and oxygen saturation is 98%.  BMI: Estimated body mass index is 36.05 kg/m as calculated from the following:   Height as of this encounter: 5' 4 (1.626 m).   Weight as of this encounter: 210 lb (95.3 kg).  Last encounter: Visit date not found. Last procedure:  11/01/2024  Reason for encounter: evaluation for possible interventional PM therapy/treatment and postprocedure evaluation and assessment.  Discussed the use of AI scribe software for clinical note transcription with the patient, who gave verbal consent to proceed.  History of Present Illness   Kristen Ortega is a 58 year old female who presents for follow-up after a lumbar facet block.  She experienced significant improvement in her lumbar pain following the second lumbar facet block, with relief lasting for about a month. Yesterday was the first day she experienced more pain than usual since the procedure. Her current pain level is 5 out of 10. She did not require sedation for the previous facet block procedures.     Procedure Type: Lumbar Facet, Medial Branch Block(s) (w/ fluoroscopic mapping) #2  Laterality: Bilateral  Level: L3, L4, and L5 Medial Branch/Dorsal Rami Level(s). Injecting these levels blocks the L3-4 and L4-5 lumbar facet joints. Imaging: Fluoroscopic guidance Spinal ([id number]) Anesthesia: Local anesthesia (1-2% Lidocaine ) DOS: 11/01/2024 Performed by: Wallie Sherry, MD   Primary Purpose: Diagnostic/Therapeutic Indications: Low back pain severe enough to impact quality of life or function. 1. Lumbar facet arthropathy   2. Lumbar spondylosis     NAS-11 Pain score:        Pre-procedure: 7 /10        Post-procedure: 0-No pain/10   Post-Procedure Evaluation  Effectiveness:  Initial hour after procedure: 100 % . Subsequent 4-6 hours post-procedure: 100 % . Analgesia past initial 6 hours: 100 % . Ongoing improvement:  Analgesic:  100% Function: Kristen Ortega reports improvement in function ROM: Ms. Kent reports improvement in ROM Interpretation: Ms. Ventress underwent a diagnostic/therapeutic lumbar facet block on November 01, 2024.  She  reports initially 100% pain relief and functional improvement during local anesthetic phase, followed by ongoing 100% pain relief  and functional improvement for approximately 1 month and then pain returned to baseline.  Pharmacotherapy Assessment   Monitoring: [place] PMP: PDMP not reviewed this encounter.       Pharmacotherapy: No side-effects or adverse reactions reported. Compliance: No problems identified. Effectiveness: Clinically acceptable.  Erlene Doyal Ortega, [place]  11/29/2024 11:14 AM  Sign when Signing Visit Safety precautions to be maintained throughout the outpatient stay will include: orient to surroundings, keep bed in low position, maintain call bell within reach at all times, provide assistance with transfer out of bed and ambulation.     UDS:  No results found for: SUMMARY  No results found for: CBDTHCR No results found for: D8THCCBX No results found for: D9THCCBX  ROS  Constitutional: Denies any fever or chills Gastrointestinal: No reported hemesis, hematochezia, vomiting, or acute GI distress Musculoskeletal: Low back pain Neurological: No reported episodes of acute onset apraxia, aphasia, dysarthria, agnosia, amnesia, paralysis, loss of coordination, or loss of consciousness  Medication Review  Fish Oil, albuterol , calcium  carbonate, diphenhydrAMINE, escitalopram , famotidine , nystatin -triamcinolone , and rosuvastatin   History Review  Allergy: Kristen Ortega has no known allergies. Drug: Kristen Ortega  reports no history of drug use. Alcohol:  reports current alcohol use. Tobacco:  reports that she has quit smoking. Her smoking use included cigarettes. She has never used smokeless tobacco. Social: Kristen Ortega  reports that she has quit smoking. Her smoking use included cigarettes. She has never used smokeless tobacco. She reports current alcohol use. She reports that she does not use drugs. Medical:  has a past medical history of Anxiety, Arthritis (09/17/2017), Cancer (HCC) (12/2017), Depression, Hyperlipidemia, Hypertension, and Vaginal Pap smear, abnormal. Surgical: Kristen Ortega  has a past  surgical history that includes Colposcopy; Cesarean section; Kidney surgery; Wisdom tooth extraction; and Colonoscopy (02/14/2020). Family: family history includes COPD in her mother; Cancer in her mother; Heart disease in her father; Hypertension in her father; Stroke in her paternal grandmother.  Laboratory Chemistry Profile   Renal Lab Results  Component Value Date   BUN 17 04/13/2024   CREATININE 0.97 04/13/2024   BCR 18 04/13/2024   GFR 77.23 11/21/2021    Hepatic Lab Results  Component Value Date   AST 22 04/13/2024   ALT 15 04/13/2024   ALBUMIN 4.3 04/13/2024   ALKPHOS 88 04/13/2024    Electrolytes Lab Results  Component Value Date   NA 144 04/13/2024   K 3.9 04/13/2024   CL 104 04/13/2024   CALCIUM  9.1 04/13/2024    Bone Lab Results  Component Value Date   VD25OH 17.75 (L) 04/03/2023    Inflammation (CRP: Acute Phase) (ESR: Chronic Phase) No results found for: CRP, ESRSEDRATE, LATICACIDVEN       Note: Above Lab results reviewed.  Recent Imaging Review  DG PAIN CLINIC C-ARM 1-60 MIN NO REPORT Fluoro was used, but no Radiologist interpretation will be provided.  Please refer to NOTES tab for provider progress note. Note: Reviewed        Physical Exam  Vitals: BP (!) 178/97 (BP Location: Right Arm, Patient Position: Sitting, Cuff Size: Normal)   Pulse 67   Temp 97.8 F (36.6 C) (Temporal)   Resp 18   Ht 5' 4 (1.626 m)   Wt 210 lb (95.3 kg)   SpO2 98%   BMI 36.05 kg/m  BMI: Estimated body mass index is 36.05 kg/m as calculated from the following:  Height as of this encounter: 5' 4 (1.626 m).   Weight as of this encounter: 210 lb (95.3 kg). Ideal: Ideal body weight: 54.7 kg (120 lb 9.5 oz) Adjusted ideal body weight: 70.9 kg (156 lb 5.7 oz) General appearance: Well nourished, well developed, and well hydrated. In no apparent acute distress Mental status: Alert, oriented x 3 (person, place, & time)       Respiratory: No evidence of acute  respiratory distress Eyes: PERLA  Musculoskeletal: +LBP Assessment   Diagnosis Status  1. Lumbar facet arthropathy   2. Lumbar spondylosis   3. Chronic radicular lumbar pain   4. Chronic pain syndrome    Having a Flare-up Having a Flare-up Controlled   Updated Problems: Problem  Lumbar Facet Arthropathy    Plan of Care  Problem-specific:  Assessment and Plan    Lumbar facet arthropathy Significant pain reduction post-second lumbar facet block. Radiofrequency ablation discussed for long-term relief, with potential 3-18 months efficacy.  - Scheduled lumbar radiofrequency ablation for January 12th, 2026.  Chronic pain syndrome Pain managed with lumbar facet blocks and planned radiofrequency ablation. Pain level at 5/10, procedures well-tolerated without sedation. - Continue current pain management plan with lumbar facet blocks and radiofrequency ablation.   Plan: (Clinic): (B/L) L-RFA # 1 with Dr. Lateef (No sedation)       Ms. Talicia Sui has a current medication list which includes the following long-term medication(s): albuterol , diphenhydramine, escitalopram , and rosuvastatin .  Pharmacotherapy (Medications Ordered): No orders of the defined types were placed in this encounter.  Orders:  Orders Placed This Encounter  Procedures   Radiofrequency,Lumbar    Standing Status:   Future    Expiration Date:   02/27/2025    Scheduling Instructions:     Side(s): Bilateral      Level: L3-4 and L4-5 Facets (L3, L4, and L5 Medial Branch)     Sedation: No Sedation.     Timeframe: As soon as schedule allows.    Where will this procedure be performed?:   [facility]        Return in about 4 weeks (around 12/27/2024) for (Clinic): (B/L) L-RFA # 1 with Dr. Lateef (No sedation).    Recent Visits Date Type Provider Dept  11/01/24 Procedure visit Marcelino Nurse, MD [facility]  10/19/24 Office Visit Marcelino Nurse, MD [facility]  09/27/24  Procedure visit Marcelino Nurse, MD [facility]  09/21/24 Office Visit Marcelino Nurse, MD [facility]  Showing recent visits within past 90 days and meeting all other requirements Today's Visits Date Type Provider Dept  11/29/24 Office Visit Keigen Caddell K, NP [facility]  Showing today's visits and meeting all other requirements Future Appointments No visits were found meeting these conditions. Showing future appointments within next 90 days and meeting all other requirements  I discussed the assessment and treatment plan with the patient. The patient was provided an opportunity to ask questions and all were answered. The patient agreed with the plan and demonstrated an understanding of the instructions.  Patient advised to call back or seek an in-person evaluation if the symptoms or condition worsens.  I personally spent a total of 20 minutes in the care of the patient today including preparing to see the patient, getting/reviewing separately obtained history, performing a medically appropriate exam/evaluation, counseling and educating, placing orders, referring and communicating with other health care professionals, documenting clinical information in the EHR, independently interpreting results, communicating results, and coordinating care.  Note by:  Johnisha Louks K Koda Defrank, NP (TTS and AI technology used. I apologize for any typographical errors that were not detected and corrected.) Date: 11/29/2024; Time: 11:48 AM

## 2024-11-29 NOTE — Patient Instructions (Addendum)
 ______________________________________________________________________    Procedure instructions  Stop blood-thinners  Do not eat or drink fluids (other than water) for 6 hours before your procedure  No water for 2 hours before your procedure  Take your blood pressure medicine with a sip of water  Arrive 30 minutes before your appointment  If sedation is planned, bring suitable driver. [place], [place], & public transportation are NOT APPROVED)  Carefully read the Preparing for your procedure detailed instructions  If you have questions call us  at [phone]  Procedure appointments are for procedures only.   NO medication refills or new problem evaluations will be done on procedure days.   Only the scheduled, pre-approved procedure and side will be done.   ______________________________________________________________________     ______________________________________________________________________    Preparing for your procedure  Appointments: If you think you may not be able to keep your appointment, call 24-48 hours in advance to cancel. We need time to make it available to others.  Procedure visits are for procedures only. During your procedure appointment there will be: NO Prescription Refills*. NO medication changes or discussions*. NO discussion of disability issues*. NO unrelated pain problem evaluations*. NO evaluations to order other pain procedures*. *These will be addressed at a separate and distinct evaluation encounter on the provider's evaluation schedule and not during procedure days.  Instructions: Food intake: Avoid eating anything solid for at least 8 hours prior to your procedure. Clear liquid intake: You may take clear liquids such as water up to 2 hours prior to your procedure. (No carbonated drinks. No soda.) Transportation: Unless otherwise stated by your physician, bring a driver. (Driver cannot be a Market Researcher, Pharmacist, Community, or any other form of public  transportation.) Morning Medicines: Except for blood thinners, take all of your other morning medications with a sip of water. Make sure to take your heart and blood pressure medicines. If your blood pressure's lower number is above 100, the case will be rescheduled. Blood thinners: Make sure to stop your blood thinners as instructed.  If you take a blood thinner, but were not instructed to stop it, call our office [phone] and ask to talk to a nurse. Not stopping a blood thinner prior to certain procedures could lead to serious complications. Diabetics on insulin: Notify the staff so that you can be scheduled 1st case in the morning. If your diabetes requires high dose insulin, take only  of your normal insulin dose the morning of the procedure and notify the staff that you have done so. Preventing infections: Shower with an antibacterial soap the morning of your procedure.  Build-up your immune system: Take 1000 mg of Vitamin C with every meal (3 times a day) the day prior to your procedure. Antibiotics: Inform the nursing staff if you are taking any antibiotics or if you have any conditions that may require antibiotics prior to procedures. (Example: recent joint implants)   Pregnancy: If you are pregnant make sure to notify the nursing staff. Not doing so may result in injury to the fetus, including death.  Sickness: If you have a cold, fever, or any active infections, call and cancel or reschedule your procedure. Receiving steroids while having an infection may result in complications. Arrival: You must be in the facility at least 30 minutes prior to your scheduled procedure. Tardiness: Your scheduled time is also the cutoff time. If you do not arrive at least 15 minutes prior to your procedure, you will be rescheduled.  Children: Do not bring any children with  you. Make arrangements to keep them home. Dress appropriately: There is always a possibility that your clothing may get soiled. Avoid  long dresses. Valuables: Do not bring any jewelry or valuables.  Reasons to call and reschedule or cancel your procedure: (Following these recommendations will minimize the risk of a serious complication.) Surgeries: Avoid having procedures within 2 weeks of any surgery. (Avoid for 2 weeks before or after any surgery). Flu Shots: Avoid having procedures within 2 weeks of a flu shots or . (Avoid for 2 weeks before or after immunizations). Barium: Avoid having a procedure within 7-10 days after having had a radiological study involving the use of radiological contrast. (Myelograms, Barium swallow or enema study). Heart attacks: Avoid any elective procedures or surgeries for the initial 6 months after a Myocardial Infarction (Heart Attack). Blood thinners: It is imperative that you stop these medications before procedures. Let us  know if you if you take any blood thinner.  Infection: Avoid procedures during or within two weeks of an infection (including chest colds or gastrointestinal problems). Symptoms associated with infections include: Localized redness, fever, chills, night sweats or profuse sweating, burning sensation when voiding, cough, congestion, stuffiness, runny nose, sore throat, diarrhea, nausea, vomiting, cold or Flu symptoms, recent or current infections. It is specially important if the infection is over the area that we intend to treat. Heart and lung problems: Symptoms that may suggest an active cardiopulmonary problem include: cough, chest pain, breathing difficulties or shortness of breath, dizziness, ankle swelling, uncontrolled high or unusually low blood pressure, and/or palpitations. If you are experiencing any of these symptoms, cancel your procedure and contact your primary care physician for an evaluation.  Remember:  Regular Business hours are:  Monday to Thursday 8:00 AM to 4:00 PM  Provider's Schedule: Eric Como, MD:  Procedure days: Tuesday and Thursday 7:30  AM to 4:00 PM  Wallie Sherry, MD:  Procedure days: Monday and Wednesday 7:30 AM to 4:00 PM Last  Updated: 11/25/2023 ______________________________________________________________________     ______________________________________________________________________    Appointment Information  It is our goal and responsibility to provide the medical community with assistance in the evaluation and management of patients with chronic pain. Unfortunately our resources are limited. Because we do not have an unlimited amount of time, or available appointments, we are required to closely monitor for unkept or cancelled appointments.  Patient's responsibilities: 1. Punctuality: Patients are required to be physically present in our office at least 15 minutes before their scheduled appointment. 2. Tardiness: Patients not physically present in our office at their scheduled appointment time will be rescheduled. 3. Plan ahead: Assume that you will encounter traffic and plan to arrive 30 minutes before your appointment. 4. Other appointments and responsibilities: Do not schedule other appointments immediately before or after your scheduled appointment.  5. Be prepared: Make a list of everything that you need to discuss with your provider so that you use your time efficiently. Once the provider leaves your room, he/she will not return to your room to discuss anything that you neglected to bring up during your allowed time. 6. No children or pets: Do not bring children or pets to your appointment. 7. Cancelling or rescheduling your appointment: Advanced notification (more than 24 hours in advance) is required. 8. No Show: Not calling to cancel an appointment and simply not showing up is unacceptable. This leads to loss of appointments that could have been used by a patient in need. (See below)  Corrective process for repeat offenders:  No  Shows: Three (3) No Shows within a 12 month period will result in an  automatic discharge from our practice. Rescheduling or cancelling with more than 24 hours notice will not be penalized and will not count against you. Tardiness: If you have to be rescheduled three (3) times due to late arrivals, it will be counted as one (1) No Show. Cancellation or reschedule: Three (3) cancellations or rescheduling where notice was given with less than 24 hours in advance, will be recorded as one (1) No Show.  Types of appointments: New patient initial evaluation: These are evaluations only. Your initial patient questionnaire will be collected and entered into the system. A history of present illness will be taken. Prior lab work, imaging studies, and associated treatments will be reviewed. The provider may order appropriate diagnostic testing depending on their evaluation and review of available information. No treatments will be started on this visit. 2nd Follow-up visit: During this visit your provider will inform you of the results of the diagnostic tests ordered on the initial evaluation. Based on the providers assessment, treatment options will be offered, at which the patient will decide if he/she is interested in the alternatives. If interested, a treatment plan will be established and started. Procedure visits: Post-procedure evaluation visits: Evaluation visits MM New problems Flare-up evaluations Follow-up after diagnostic testing ______________________________________________________________________     Radiofrequency Ablation Radiofrequency ablation is a procedure that is performed to relieve pain. The procedure is often used for back, neck, or arm pain. Radiofrequency ablation involves the use of a machine that creates radio waves to make heat. During the procedure, the heat is applied to the nerve that carries the pain signal. The heat damages the nerve and interferes with the pain signal. Pain relief usually starts about 2 weeks after the procedure and lasts  for 6 months to 1 year. Tell a health care provider about: Any allergies you have. All medicines you are taking, including vitamins, herbs, eye drops, creams, and over-the-counter medicines. Any problems you or family members have had with anesthetic medicines. Any bleeding problems you have. Any surgeries you have had. Any medical conditions you have. Whether you are pregnant or may be pregnant. What are the risks? Generally, this is a safe procedure. However, problems may occur, including: Pain or soreness at the injection site. Allergic reaction to medicines given during the procedure. Bleeding. Infection at the injection site. Damage to nerves or blood vessels. What happens before the procedure? When to stop eating and drinking Follow instructions from your health care provider about what you may eat and drink before your procedure. These may include: 8 hours before the procedure Stop eating most foods. Do not eat meat, fried foods, or fatty foods. Eat only light foods, such as toast or crackers. All liquids are okay except energy drinks and alcohol. 6 hours before the procedure Stop eating. Drink only clear liquids, such as water, clear fruit juice, black coffee, plain tea, and sports drinks. Do not drink energy drinks or alcohol. 2 hours before the procedure Stop drinking all liquids. You may be allowed to take medicine with small sips of water. If you do not follow your health care provider's instructions, your procedure may be delayed or canceled. Medicines Ask your health care provider about: Changing or stopping your regular medicines. This is especially important if you are taking diabetes medicines or blood thinners. Taking medicines such as aspirin and ibuprofen. These medicines can thin your blood. Do not take these medicines unless your health  care provider tells you to take them. Taking over-the-counter medicines, vitamins, herbs, and supplements. General  instructions Ask your health care provider what steps will be taken to help prevent infection. These steps may include: Removing hair at the procedure site. Washing skin with a germ-killing soap. Taking antibiotic medicine. If you will be going home right after the procedure, plan to have a responsible adult: Take you home from the hospital or clinic. You will not be allowed to drive. Care for you for the time you are told. What happens during the procedure?  You will be awake during the procedure. You will need to be able to talk with the health care provider during the procedure. An IV will be inserted into one of your veins. You will be given one or more of the following: A medicine to help you relax (sedative). A medicine to numb the area (local anesthetic). Your health care provider will insert a radiofrequency needle into the area to be treated. This is done with the help of fluoroscopy. A wire that carries the radio waves (electrode) will be put through the radiofrequency needle. An electrical pulse will be sent through the electrode to verify the correct nerve that is causing your pain. You will feel a tingling sensation, and you may have muscle twitching. The tissue around the needle tip will be heated by an electric current that comes from the radiofrequency machine. This will numb the nerves. The needle will be removed. A bandage (dressing) will be put on the insertion area. The procedure may vary among health care providers and hospitals. What happens after the procedure? Your blood pressure, heart rate, breathing rate, and blood oxygen level will be monitored until you leave the hospital or clinic. Return to your normal activities as told by your health care provider. Ask your health care provider what activities are safe for you. If you were given a sedative during the procedure, it can affect you for several hours. Do not drive or operate machinery until your health care  provider says that it is safe. Summary Radiofrequency ablation is a procedure that is performed to relieve pain. The procedure is often used for back, neck, or arm pain. Radiofrequency ablation involves the use of a machine that creates radio waves to make heat. Plan to have a responsible adult take you home from the hospital or clinic. Do not drive or operate machinery until your health care provider says that it is safe. Return to your normal activities as told by your health care provider. Ask your health care provider what activities are safe for you. This information is not intended to replace advice given to you by your health care provider. Make sure you discuss any questions you have with your health care provider. Document Revised: 05/22/2021 Document Reviewed: 05/22/2021 [employer]vier Patient Education  2024 [employer].

## 2024-11-29 NOTE — Progress Notes (Signed)
 Safety precautions to be maintained throughout the outpatient stay will include: orient to surroundings, keep bed in low position, maintain call bell within reach at all times, provide assistance with transfer out of bed and ambulation.

## 2024-12-20 ENCOUNTER — Ambulatory Visit
Admission: RE | Admit: 2024-12-20 | Discharge: 2024-12-20 | Disposition: A | Discharge status: Home / Self Care | Source: Ambulatory Visit | Attending: Student in an Organized Health Care Education/Training Program | Admitting: Student in an Organized Health Care Education/Training Program

## 2024-12-20 ENCOUNTER — Ambulatory Visit: Admitting: Student in an Organized Health Care Education/Training Program

## 2024-12-20 ENCOUNTER — Encounter: Payer: Self-pay | Admitting: Student in an Organized Health Care Education/Training Program

## 2024-12-20 VITALS — BP 149/99 | HR 62 | Temp 97.5°F | Resp 16 | Ht 64.0 in | Wt 210.0 lb

## 2024-12-20 DIAGNOSIS — M47816 Spondylosis without myelopathy or radiculopathy, lumbar region: Secondary | ICD-10-CM | POA: Insufficient documentation

## 2024-12-20 DIAGNOSIS — G894 Chronic pain syndrome: Secondary | ICD-10-CM

## 2024-12-20 MED ORDER — DEXAMETHASONE SOD PHOSPHATE PF 10 MG/ML IJ SOLN
20.0000 mg | Freq: Once | INTRAMUSCULAR | Status: AC
  Administered 2024-12-20: 20 mg

## 2024-12-20 MED ORDER — LIDOCAINE HCL 2 % IJ SOLN
20.0000 mL | Freq: Once | INTRAMUSCULAR | Status: AC
  Administered 2024-12-20: 400 mg
  Filled 2024-12-20: qty 20

## 2024-12-20 MED ORDER — ROPIVACAINE HCL 2 MG/ML IJ SOLN
9.0000 mL | Freq: Once | INTRAMUSCULAR | Status: AC
  Administered 2024-12-20: 9 mL via PERINEURAL
  Filled 2024-12-20: qty 20

## 2024-12-20 NOTE — Patient Instructions (Signed)

## 2024-12-20 NOTE — Progress Notes (Signed)
 PROVIDER NOTE: Interpretation of information contained herein should be left to medically-trained personnel. Specific patient instructions are provided elsewhere under Patient Instructions section of medical record. This document was created in part using STT-dictation technology, any transcriptional errors that may result from this process are unintentional.  Patient: Kristen Ortega Type: Established DOB: [birth year] MRN: [id number] PCP: Watt Harlene BROCKS, MD  Service: Procedure DOS: 12/20/2024 Setting: Ambulatory Location: Ambulatory outpatient facility Delivery: Face-to-face Provider: Wallie Sherry, MD Specialty: Interventional Pain Management Specialty designation: 09 Location: Outpatient facility Ref. Prov.: Patel, Seema K, NP       Interventional Therapy   Procedure: Lumbar Facet, Medial Branch Radiofrequency Ablation (RFA) #1  Laterality: Bilateral (-50)  Level: L3, L4, and L5 Medial Branch Level(s). These levels will denervate the L3-4 and L4-5 lumbar facet joints.  Imaging: Fluoroscopy-guided         Anesthesia: Local anesthesia (1-2% Lidocaine ) Sedation: No Sedation                       DOS: 12/20/2024  Performed by: Wallie Sherry, MD  Purpose: Therapeutic/Palliative Indications: Low back pain severe enough to impact quality of life or function. Indications: 1. Lumbar facet arthropathy   2. Lumbar spondylosis   3. Chronic pain syndrome    Kristen Ortega has been dealing with the above chronic pain for longer than three months and has either failed to respond, was unable to tolerate, or simply did not get enough benefit from other more conservative therapies including, but not limited to: 1. Over-the-counter medications 2. Anti-inflammatory medications 3. Muscle relaxants 4. Membrane stabilizers 5. Opioids 6. Physical therapy and/or chiropractic manipulation 7. Modalities (Heat, ice, etc.) 8. Invasive techniques such as nerve blocks. Kristen Ortega has attained more than 50%  relief of the pain from a series of diagnostic injections conducted in separate occasions.  Pain Score: Pre-procedure: 7 /10 Post-procedure: 4 /10     Position / Prep / Materials:  Position: Prone  Prep solution: ChloraPrep (2% chlorhexidine gluconate and 70% isopropyl alcohol) Prep Area: Entire Lumbosacral Region (Lower back from mid-thoracic region to end of tailbone and from flank to flank.) Materials:  Tray: RFA (Radiofrequency) tray Needle(s):  Type: RFA (Teflon-coated radiofrequency ablation needles) Gauge (G): 14  Length: Long (15cm) Qty: 6     H&P (Pre-op Assessment):  Kristen Ortega is a 59 y.o. (year old), female patient, seen today for interventional treatment. She  has a past surgical history that includes Colposcopy; Cesarean section; Kidney surgery; Wisdom tooth extraction; and Colonoscopy (02/14/2020). Kristen Ortega has a current medication list which includes the following prescription(s): albuterol , calcium  carbonate, diphenhydramine, escitalopram , famotidine , nystatin -triamcinolone , fish oil, and rosuvastatin . Her primarily concern today is the Back Pain (lower)  Initial Vital Signs:  Pulse/HCG Rate: 62ECG Heart Rate: 64 Temp: (!) 97.5 F (36.4 C) Resp: 18 BP: (!) 181/85 SpO2: 96 %  BMI: Estimated body mass index is 36.05 kg/m as calculated from the following:   Height as of this encounter: 5' 4 (1.626 m).   Weight as of this encounter: 210 lb (95.3 kg).  Risk Assessment: Allergies: Reviewed. She has no known allergies.  Allergy Precautions: None required Coagulopathies: Reviewed. None identified.  Blood-thinner therapy: None at this time Active Infection(s): Reviewed. None identified. [name] is afebrile  Site Confirmation: Kristen Ortega was asked to confirm the procedure and laterality before marking the site Procedure checklist: Completed Consent: Before the procedure and under the influence of no sedative(s), amnesic(s), or anxiolytics, the patient was  informed of the treatment options, risks and possible complications. To fulfill our ethical and legal obligations, as recommended by the American Medical Association's Code of Ethics, I have informed the patient of my clinical impression; the nature and purpose of the treatment or procedure; the risks, benefits, and possible complications of the intervention; the alternatives, including doing nothing; the risk(s) and benefit(s) of the alternative treatment(s) or procedure(s); and the risk(s) and benefit(s) of doing nothing. The patient was provided information about the general risks and possible complications associated with the procedure. These may include, but are not limited to: failure to achieve desired goals, infection, bleeding, organ or nerve damage, allergic reactions, paralysis, and death. In addition, the patient was informed of those risks and complications associated to Spine-related procedures, such as failure to decrease pain; infection (i.e.: Meningitis, epidural or intraspinal abscess); bleeding (i.e.: epidural hematoma, subarachnoid hemorrhage, or any other type of intraspinal or peri-dural bleeding); organ or nerve damage (i.e.: Any type of peripheral nerve, nerve root, or spinal cord injury) with subsequent damage to sensory, motor, and/or autonomic systems, resulting in permanent pain, numbness, and/or weakness of one or several areas of the body; allergic reactions; (i.e.: anaphylactic reaction); and/or death. Furthermore, the patient was informed of those risks and complications associated with the medications. These include, but are not limited to: allergic reactions (i.e.: anaphylactic or anaphylactoid reaction(s)); adrenal axis suppression; blood sugar elevation that in diabetics may result in ketoacidosis or comma; water retention that in patients with history of congestive heart failure may result in shortness of breath, pulmonary edema, and decompensation with resultant heart  failure; weight gain; swelling or edema; medication-induced neural toxicity; particulate matter embolism and blood vessel occlusion with resultant organ, and/or nervous system infarction; and/or aseptic necrosis of one or more joints. Finally, the patient was informed that Medicine is not an exact science; therefore, there is also the possibility of unforeseen or unpredictable risks and/or possible complications that may result in a catastrophic outcome. The patient indicated having understood very clearly. We have given the patient no guarantees and we have made no promises. Enough time was given to the patient to ask questions, all of which were answered to the patient's satisfaction. Ms. Nessel has indicated that she wanted to continue with the procedure. Attestation: I, the ordering provider, attest that I have discussed with the patient the benefits, risks, side-effects, alternatives, likelihood of achieving goals, and potential problems during recovery for the procedure that I have provided informed consent. Date  Time: 12/20/2024 11:05 AM  Pre-Procedure Preparation:  Monitoring: As per clinic protocol. Respiration, ETCO2, SpO2, BP, heart rate and rhythm monitor placed and checked for adequate function Safety Precautions: Patient was assessed for positional comfort and pressure points before starting the procedure. Time-out: I initiated and conducted the Time-out before starting the procedure, as per protocol. The patient was asked to participate by confirming the accuracy of the Time Out information. Verification of the correct person, site, and procedure were performed and confirmed by me, the nursing staff, and the patient. Time-out conducted as per Joint Commission's Universal Protocol ([email or web address].01.01.01). Time: 1122 Start Time: 1122 hrs.  Description of Procedure:          Laterality: See above. Levels:  See above. Safety Precautions: Aspiration looking for blood return was conducted prior  to all injections. At no point did we inject any substances, as a needle was being advanced. Before injecting, the patient was told to immediately notify me if she was experiencing any new onset of  ringing in the ears, or metallic taste in the mouth. No attempts were made at seeking any paresthesias. Safe injection practices and needle disposal techniques used. Medications properly checked for expiration dates. SDV (single dose vial) medications used. After the completion of the procedure, all disposable equipment used was discarded in the proper designated medical waste containers. Local Anesthesia: Protocol guidelines were followed. The patient was positioned over the fluoroscopy table. The area was prepped in the usual manner. The time-out was completed. The target area was identified using fluoroscopy. A 12-in long, straight, sterile hemostat was used with fluoroscopic guidance to locate the targets for each level blocked. Once located, the skin was marked with an approved surgical skin marker. Once all sites were marked, the skin (epidermis, dermis, and hypodermis), as well as deeper tissues (fat, connective tissue and muscle) were infiltrated with a small amount of a short-acting local anesthetic, loaded on a 10cc syringe with a 25G, 1.5-in  Needle. An appropriate amount of time was allowed for local anesthetics to take effect before proceeding to the next step. Technical description of process:  Radiofrequency Ablation (RFA) L3 Medial Branch Nerve RFA: The target area for the L3 medial branch is at the junction of the postero-lateral aspect of the superior articular process and the superior, posterior, and medial edge of the transverse process of L4. Under fluoroscopic guidance, a Radiofrequency needle was inserted until contact was made with os over the superior postero-lateral aspect of the pedicular shadow (target area). Sensory and motor testing was conducted to properly adjust the position of the  needle. Once satisfactory placement of the needle was achieved, the numbing solution was slowly injected after negative aspiration for blood. 2.0 mL of the nerve block solution was injected without difficulty or complication. After waiting for at least 3 minutes, the ablation was performed. Once completed, the needle was removed intact. L4 Medial Branch Nerve RFA: The target area for the L4 medial branch is at the junction of the postero-lateral aspect of the superior articular process and the superior, posterior, and medial edge of the transverse process of L5. Under fluoroscopic guidance, a Radiofrequency needle was inserted until contact was made with os over the superior postero-lateral aspect of the pedicular shadow (target area). Sensory and motor testing was conducted to properly adjust the position of the needle. Once satisfactory placement of the needle was achieved, the numbing solution was slowly injected after negative aspiration for blood. 2.0 mL of the nerve block solution was injected without difficulty or complication. After waiting for at least 3 minutes, the ablation was performed. Once completed, the needle was removed intact. L5 Medial Branch Nerve RFA: The target area for the L5 medial branch is at the junction of the postero-lateral aspect of the superior articular process of S1 and the superior, posterior, and medial edge of the sacral ala. Under fluoroscopic guidance, a Radiofrequency needle was inserted until contact was made with os over the superior postero-lateral aspect of the pedicular shadow (target area). Sensory and motor testing was conducted to properly adjust the position of the needle. Once satisfactory placement of the needle was achieved, the numbing solution was slowly injected after negative aspiration for blood. 2.0 mL of the nerve block solution was injected without difficulty or complication. After waiting for at least 3 minutes, the ablation was performed. Once  completed, the needle was removed intact.  Radiofrequency lesioning (ablation):  Radiofrequency Generator: Medtronic AccurianTM AG 1000 RF Generator Sensory Stimulation Parameters: 50 Hz was used to locate &  identify the nerve, making sure that the needle was positioned such that there was no sensory stimulation below 0.3 V or above 0.7 V. Motor Stimulation Parameters: 2 Hz was used to evaluate the motor component. Care was taken not to lesion any nerves that demonstrated motor stimulation of the lower extremities at an output of less than 2.5 times that of the sensory threshold, or a maximum of 2.0 V. Lesioning Technique Parameters: Standard Radiofrequency settings. (Not bipolar or pulsed.) Temperature Settings: 80 degrees C Lesioning time: 60 seconds Stationary intra-operative compliance: Compliant  Once the entire procedure was completed, the treated area was cleaned, making sure to leave some of the prepping solution back to take advantage of its long term bactericidal properties.    Illustration of the posterior view of the lumbar spine and the posterior neural structures. Laminae of L2 through S1 are labeled. DPRL5, dorsal primary ramus of L5; DPRS1, dorsal primary ramus of S1; DPR3, dorsal primary ramus of L3; FJ, facet (zygapophyseal) joint L3-L4; I, inferior articular process of L4; LB1, lateral branch of dorsal primary ramus of L1; IAB, inferior articular branches from L3 medial branch (supplies L4-L5 facet joint); IBP, intermediate branch plexus; MB3, medial branch of dorsal primary ramus of L3; NR3, third lumbar nerve root; S, superior articular process of L5; SAB, superior articular branches from L4 (supplies L4-5 facet joint also); TP3, transverse process of L3.  Facet Joint Innervation (* possible contribution)  L1-2 T12, L1 (L2*)  Medial Branch  L2-3 L1, L2 (L3*)                     L3-4 L2, L3 (L4*)                     L4-5 L3, L4 (L5*)                     L5-S1 L4, L5, S1                         Vitals:   12/20/24 1132 12/20/24 1137 12/20/24 1142 12/20/24 1147  BP: (!) 180/108 (!) 146/102 (!) 151/101 (!) 149/99  Pulse:      Resp: 16 17 15 16   Temp:      TempSrc:      SpO2: 99% 98% 99% 99%  Weight:      Height:        Start Time: 1122 hrs. End Time: 1140 hrs.  Imaging Guidance (Spinal):         Type of Imaging Technique: Fluoroscopy Guidance (Spinal) Indication(s): Fluoroscopy guidance for needle placement to enhance accuracy in procedures requiring precise needle localization for targeted delivery of medication in or near specific anatomical locations not easily accessible without such real-time imaging assistance. Exposure Time: Please see nurses notes. Contrast: None used. Fluoroscopic Guidance: I was personally present during the use of fluoroscopy. Tunnel Vision Technique used to obtain the best possible view of the target area. Parallax error corrected before commencing the procedure. Direction-depth-direction technique used to introduce the needle under continuous pulsed fluoroscopy. Once target was reached, antero-posterior, oblique, and lateral fluoroscopic projection used confirm needle placement in all planes. Images permanently stored in EMR. Interpretation: No contrast injected. I personally interpreted the imaging intraoperatively. Adequate needle placement confirmed in multiple planes. Permanent images saved into the patient's record.  Antibiotic Prophylaxis:   Anti-infectives (From admission, onward)    None      Indication(s): None  identified  Post-operative Assessment:  Post-procedure Vital Signs:  Pulse/HCG Rate: 6265 Temp: (!) 97.5 F (36.4 C) Resp: 16 BP: (!) 149/99 SpO2: 99 %  EBL: None  Complications: No immediate post-treatment complications observed by team, or reported by patient.  Note: The patient tolerated the entire procedure well. A repeat set of vitals were taken after the procedure and the patient  was kept under observation following institutional policy, for this type of procedure. Post-procedural neurological assessment was performed, showing return to baseline, prior to discharge. The patient was provided with post-procedure discharge instructions, including a section on how to identify potential problems. Should any problems arise concerning this procedure, the patient was given instructions to immediately contact us , at any time, without hesitation. In any case, we plan to contact the patient by telephone for a follow-up status report regarding this interventional procedure.  Comments:  No additional relevant information.  Plan of Care (POC)  Orders:  Orders Placed This Encounter  Procedures   DG PAIN CL[facility] C-ARM 1-60 MIN NO REPORT    Intraoperative interpretation by procedural physician at [facility].    Standing Status:   Standing    Number of Occurrences:   1    Reason for exam::   Assistance in needle guidance and placement for procedures requiring needle placement in or near specific anatomical locations not easily accessible without such assistance.    Medications ordered for procedure: Meds ordered this encounter  Medications   lidocaine  (XYLOCAINE ) 2 % (with pres) injection 400 mg   ropivacaine  (PF) 2 mg/mL (0.2%) (NAROPIN ) injection 9 mL   dexamethasone  (DECADRON ) injection 20 mg   Medications administered: We administered lidocaine , ropivacaine  (PF) 2 mg/mL (0.2%), and dexamethasone .  See the medical record for exact dosing, route, and time of administration.    B/L L3,4,5 MBNB 09/27/24, 11/01/24; RFA 12/20/24    Follow-up plan:   Return in about 8 weeks (around 02/14/2025) for PPE, F2F.     Recent Visits Date Type Provider Dept  11/29/24 Office Visit Patel, Seema K, NP [facility]  11/01/24 Procedure visit Marcelino Nurse, MD [facility]  10/19/24 Office Visit Marcelino Nurse, MD [facility]  09/27/24 Procedure visit  Marcelino Nurse, MD [facility]  09/21/24 Office Visit Marcelino Nurse, MD [facility]  Showing recent visits within past 90 days and meeting all other requirements Today's Visits Date Type Provider Dept  12/20/24 Procedure visit Marcelino Nurse, MD [facility]  Showing today's visits and meeting all other requirements Future Appointments Date Type Provider Dept  02/15/25 Appointment Marcelino Nurse, MD [facility]  Showing future appointments within next 90 days and meeting all other requirements   Disposition: Discharge home  Discharge (Date  Time): 12/20/2024; 1155 hrs.   Primary Care Physician: Watt Harlene BROCKS, MD Location: [facility] Note by: Nurse Marcelino, MD (TTS technology used. I apologize for any typographical errors that were not detected and corrected.) Date: 12/20/2024; Time: 1:12 PM  Disclaimer:  Medicine is not an visual merchandiser. The only guarantee in medicine is that nothing is guaranteed. It is important to note that the decision to proceed with this intervention was based on the information collected from the patient. The Data and conclusions were drawn from the patient's questionnaire, the interview, and the physical examination. Because the information was provided in large part by the patient, it cannot be guaranteed that it has not been purposely or unconsciously manipulated. Every effort has been  made to obtain as much relevant data as possible for this evaluation. It is important to note that the conclusions that lead to this procedure are derived in large part from the available data. Always take into account that the treatment will also be dependent on availability of resources and existing treatment guidelines, considered by other Pain Management Practitioners as being common knowledge and practice, at the time of the intervention. For Medico-Legal purposes, it is also important to point out that variation in  procedural techniques and pharmacological choices are the acceptable norm. The indications, contraindications, technique, and results of the above procedure should only be interpreted and judged by a Board-Certified Interventional Pain Specialist with extensive familiarity and expertise in the same exact procedure and technique.

## 2025-02-15 ENCOUNTER — Ambulatory Visit: Admitting: Student in an Organized Health Care Education/Training Program

## 2025-04-13 ENCOUNTER — Ambulatory Visit: Admitting: Urology
